# Patient Record
Sex: Female | Born: 1935 | Race: White | Hispanic: No | Marital: Married | Smoking: Never smoker
Health system: Southern US, Community
[De-identification: ages and names within clinical notes are randomized; demographics above are authoritative.]

## PROBLEM LIST (undated history)

## (undated) DIAGNOSIS — I10 Essential (primary) hypertension: Secondary | ICD-10-CM

## (undated) DIAGNOSIS — C50919 Malignant neoplasm of unspecified site of unspecified female breast: Secondary | ICD-10-CM

## (undated) DIAGNOSIS — E785 Hyperlipidemia, unspecified: Secondary | ICD-10-CM

## (undated) DIAGNOSIS — Z923 Personal history of irradiation: Secondary | ICD-10-CM

## (undated) HISTORY — DX: Hyperlipidemia, unspecified: E78.5

## (undated) HISTORY — DX: Essential (primary) hypertension: I10

---

## 1998-08-22 DIAGNOSIS — C50919 Malignant neoplasm of unspecified site of unspecified female breast: Secondary | ICD-10-CM

## 1998-08-22 HISTORY — PX: BREAST LUMPECTOMY: SHX2

## 1998-08-22 HISTORY — DX: Malignant neoplasm of unspecified site of unspecified female breast: C50.919

## 1999-04-16 ENCOUNTER — Encounter (INDEPENDENT_AMBULATORY_CARE_PROVIDER_SITE_OTHER): Payer: Self-pay | Admitting: Specialist

## 1999-04-16 ENCOUNTER — Ambulatory Visit (HOSPITAL_COMMUNITY): Admission: RE | Admit: 1999-04-16 | Discharge: 1999-04-16 | Payer: Self-pay | Admitting: Surgery

## 1999-04-16 ENCOUNTER — Encounter: Payer: Self-pay | Admitting: Surgery

## 1999-04-30 ENCOUNTER — Ambulatory Visit (HOSPITAL_BASED_OUTPATIENT_CLINIC_OR_DEPARTMENT_OTHER): Admission: RE | Admit: 1999-04-30 | Discharge: 1999-04-30 | Payer: Self-pay | Admitting: Surgery

## 1999-04-30 ENCOUNTER — Encounter: Payer: Self-pay | Admitting: Surgery

## 1999-05-26 ENCOUNTER — Encounter: Admission: RE | Admit: 1999-05-26 | Discharge: 1999-08-24 | Payer: Self-pay | Admitting: Radiation Oncology

## 1999-06-02 ENCOUNTER — Encounter: Admission: RE | Admit: 1999-06-02 | Discharge: 1999-06-02 | Payer: Self-pay | Admitting: Geriatric Medicine

## 1999-06-04 ENCOUNTER — Encounter: Admission: RE | Admit: 1999-06-04 | Discharge: 1999-06-04 | Payer: Self-pay | Admitting: Oncology

## 1999-12-01 ENCOUNTER — Encounter: Admission: RE | Admit: 1999-12-01 | Discharge: 1999-12-01 | Payer: Self-pay | Admitting: Oncology

## 1999-12-01 ENCOUNTER — Encounter: Payer: Self-pay | Admitting: Oncology

## 2000-05-01 ENCOUNTER — Other Ambulatory Visit: Admission: RE | Admit: 2000-05-01 | Discharge: 2000-05-01 | Payer: Self-pay | Admitting: *Deleted

## 2000-05-11 ENCOUNTER — Encounter: Admission: RE | Admit: 2000-05-11 | Discharge: 2000-05-11 | Payer: Self-pay | Admitting: *Deleted

## 2000-05-11 ENCOUNTER — Encounter: Payer: Self-pay | Admitting: *Deleted

## 2000-07-11 ENCOUNTER — Ambulatory Visit (HOSPITAL_COMMUNITY): Admission: RE | Admit: 2000-07-11 | Discharge: 2000-07-11 | Payer: Self-pay | Admitting: *Deleted

## 2000-07-11 ENCOUNTER — Encounter (INDEPENDENT_AMBULATORY_CARE_PROVIDER_SITE_OTHER): Payer: Self-pay

## 2000-12-05 ENCOUNTER — Encounter: Admission: RE | Admit: 2000-12-05 | Discharge: 2000-12-05 | Payer: Self-pay | Admitting: Oncology

## 2000-12-05 ENCOUNTER — Encounter: Payer: Self-pay | Admitting: Oncology

## 2001-05-04 ENCOUNTER — Other Ambulatory Visit: Admission: RE | Admit: 2001-05-04 | Discharge: 2001-05-04 | Payer: Self-pay | Admitting: *Deleted

## 2001-05-31 ENCOUNTER — Encounter: Admission: RE | Admit: 2001-05-31 | Discharge: 2001-05-31 | Payer: Self-pay | Admitting: Oncology

## 2001-05-31 ENCOUNTER — Encounter: Payer: Self-pay | Admitting: Oncology

## 2002-01-01 ENCOUNTER — Encounter: Payer: Self-pay | Admitting: Oncology

## 2002-01-01 ENCOUNTER — Encounter: Admission: RE | Admit: 2002-01-01 | Discharge: 2002-01-01 | Payer: Self-pay | Admitting: Oncology

## 2002-06-04 ENCOUNTER — Encounter: Admission: RE | Admit: 2002-06-04 | Discharge: 2002-06-04 | Payer: Self-pay | Admitting: Oncology

## 2002-06-04 ENCOUNTER — Encounter: Payer: Self-pay | Admitting: Oncology

## 2002-06-04 ENCOUNTER — Encounter (INDEPENDENT_AMBULATORY_CARE_PROVIDER_SITE_OTHER): Payer: Self-pay | Admitting: *Deleted

## 2002-06-13 ENCOUNTER — Other Ambulatory Visit: Admission: RE | Admit: 2002-06-13 | Discharge: 2002-06-13 | Payer: Self-pay | Admitting: Obstetrics and Gynecology

## 2003-03-13 ENCOUNTER — Encounter: Payer: Self-pay | Admitting: Oncology

## 2003-03-13 ENCOUNTER — Encounter: Admission: RE | Admit: 2003-03-13 | Discharge: 2003-03-13 | Payer: Self-pay | Admitting: Oncology

## 2003-06-11 ENCOUNTER — Encounter: Payer: Self-pay | Admitting: Oncology

## 2003-06-11 ENCOUNTER — Encounter: Admission: RE | Admit: 2003-06-11 | Discharge: 2003-06-11 | Payer: Self-pay | Admitting: Oncology

## 2003-06-23 ENCOUNTER — Other Ambulatory Visit: Admission: RE | Admit: 2003-06-23 | Discharge: 2003-06-23 | Payer: Self-pay | Admitting: Obstetrics and Gynecology

## 2003-09-03 ENCOUNTER — Encounter (INDEPENDENT_AMBULATORY_CARE_PROVIDER_SITE_OTHER): Payer: Self-pay | Admitting: Specialist

## 2003-09-03 ENCOUNTER — Ambulatory Visit (HOSPITAL_COMMUNITY): Admission: RE | Admit: 2003-09-03 | Discharge: 2003-09-03 | Payer: Self-pay | Admitting: Obstetrics and Gynecology

## 2004-06-11 ENCOUNTER — Encounter: Admission: RE | Admit: 2004-06-11 | Discharge: 2004-06-11 | Payer: Self-pay | Admitting: Oncology

## 2004-07-06 ENCOUNTER — Other Ambulatory Visit: Admission: RE | Admit: 2004-07-06 | Discharge: 2004-07-06 | Payer: Self-pay | Admitting: Obstetrics and Gynecology

## 2005-05-24 ENCOUNTER — Ambulatory Visit (HOSPITAL_COMMUNITY): Admission: RE | Admit: 2005-05-24 | Discharge: 2005-05-24 | Payer: Self-pay | Admitting: Gastroenterology

## 2005-06-20 ENCOUNTER — Encounter: Admission: RE | Admit: 2005-06-20 | Discharge: 2005-06-20 | Payer: Self-pay | Admitting: Surgery

## 2005-07-12 ENCOUNTER — Other Ambulatory Visit: Admission: RE | Admit: 2005-07-12 | Discharge: 2005-07-12 | Payer: Self-pay | Admitting: Obstetrics and Gynecology

## 2006-06-21 ENCOUNTER — Encounter: Admission: RE | Admit: 2006-06-21 | Discharge: 2006-06-21 | Payer: Self-pay | Admitting: Surgery

## 2007-06-25 ENCOUNTER — Encounter: Admission: RE | Admit: 2007-06-25 | Discharge: 2007-06-25 | Payer: Self-pay | Admitting: Obstetrics and Gynecology

## 2007-09-28 ENCOUNTER — Other Ambulatory Visit: Admission: RE | Admit: 2007-09-28 | Discharge: 2007-09-28 | Payer: Self-pay | Admitting: Obstetrics and Gynecology

## 2008-07-10 ENCOUNTER — Encounter: Admission: RE | Admit: 2008-07-10 | Discharge: 2008-07-10 | Payer: Self-pay | Admitting: Geriatric Medicine

## 2008-10-15 ENCOUNTER — Other Ambulatory Visit: Admission: RE | Admit: 2008-10-15 | Discharge: 2008-10-15 | Payer: Self-pay | Admitting: Obstetrics and Gynecology

## 2009-07-13 ENCOUNTER — Encounter: Admission: RE | Admit: 2009-07-13 | Discharge: 2009-07-13 | Payer: Self-pay | Admitting: Obstetrics and Gynecology

## 2009-11-11 ENCOUNTER — Other Ambulatory Visit: Admission: RE | Admit: 2009-11-11 | Discharge: 2009-11-11 | Payer: Self-pay | Admitting: Obstetrics and Gynecology

## 2010-07-20 ENCOUNTER — Encounter: Admission: RE | Admit: 2010-07-20 | Discharge: 2010-07-20 | Payer: Self-pay | Admitting: Geriatric Medicine

## 2011-01-07 NOTE — H&P (Signed)
NAME:  Samantha Bean, Samantha Bean NO.:  [id number]   MEDICAL RECORD NO.:  [id number]                   PATIENT TYPE:  AMB   LOCATION:  SDC                                  FACILITY:  [facility]   PHYSICIAN:  Artist Pais, M.D.                 DATE OF BIRTH:  07/01/1936   DATE OF ADMISSION:  09/03/2003  DATE OF DISCHARGE:                                HISTORY & PHYSICAL   PREOPERATIVE HISTORY AND PHYSICAL:   HISTORY OF PRESENT ILLNESS:  The patient is a 75 year old Caucasian female  gravida 4, para 4 who was diagnosed with left breast cancer September 2000;  she has been on tamoxifen for 4 years.  At the time of her annual  examination June 23, 2003 I offered her an ultrasound of her ovaries and  endometrium because patients with breast cancer have an increased risk of  another glandular cancer, at that time she had been on tamoxifen for 4  years, she desired to pursue the pelvic ultrasound, pelvic ultrasound was  performed and revealed the uterus to measure 7.6 x 3.7 x 4.8 cm, however,  her endometrium was quite thick measuring 14.6 mm with numerous cystic fluid  spaces and a heterogeneous echo pattern, her right and left ovaries were  noted to be normal.  She subsequently underwent a sonohistogram at which  point she was found to have fluid-filled spaces in the endometrial cavity  consistent with probable polyps and a mixed echo focus noted in the lower  endometrial cavity on the posterior wall measuring 9 x 4 mm.  The decision  was made to perform a dilation and curettage hysteroscopy because of the  mixed echogenic focus in the endometrial cavity.  Of note the fluid-filled  spaces within the myometrium was thought to be adenomyosis.  She was found  to have a mixed echogenic focus in her lower endometrial cavity on the  posterior wall of the endometrial cavity and this is the reason for her to  undergo a D&C hysteroscopy to rule out endometrial cancer.  Risks of  surgery  including anesthetic complication, hemorrhage, infection, damage to adjacent  structures including bladder, bowel, blood vessels, the ureter are discussed  with the patient.  She is made aware of the risk of uterine perforation  which could result in overwhelming life-threatening hemorrhage requiring an  emergent hysterectomy and uterine perforation which could result in bowel  damage requiring emergent colostomy or which could result in overwhelming  life-threatening peritonitis were discussed the patient.  She expressed  understanding of and acceptance of these risks and desires to proceed with  surgery.   PAST MEDICAL HISTORY:  History of lichen sclerosis but the patient has not  been following up with her treatment, vertigo, history of left breast cancer  as above, and osteopenia.   ALLERGIES:  No known drug allergies.   CURRENT MEDICATIONS:  Tamoxifen for 4 years with  Flonase and fluconazole.  She does not require SBE prophylaxis.  She occasionally takes Aleve but was  urged to discontinue this prior to her surgery.   SURGERIES:  Lumpectomy September 2000, shoulder surgery October 2002.   FAMILY HISTORY:  There is no family history of colon, breast, ovarian  cancer.  The patient's mother died at 24 of heart disease; her father died  at 73 of prostate cancer.  One sister is 58 with hypercholesterolemia,  another sister has hypertension, heart disease, and hypothyroidism.  She has  four children - the eldest of which has thyroid cancer; children range in  age from 22 to 45 years old - all of whom are alive and well.   OCCUPATION:  The patient is retired from __________ [employer].  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   TOBACCO:  None.   ALCOHOL:  None.   REVIEW OF SYSTEMS:  Noncontributory except as noted above.  Denies headache,  visual changes, chest pain, shortness of breath, abdominal pain,  change in  bowel habits, unintentional weight loss, dysuria, urgency, frequency,  vaginal pruritus or discharge, pain or bleeding with intercourse.   PHYSICAL EXAMINATION:  GENERAL APPEARANCE:  Well-developed Caucasian female.  VITAL SIGNS:  Blood pressure 136/76, height 5 feet 3-1/2 inches, weight 127.  HEENT:  Normal.  NECK:  Supple without thyromegaly, adenopathy, or nodules.  CHEST:  Clear to auscultation.  BREASTS:  The right breast is without masses, no nipple retraction or nipple  discharge.  Left breast there is a well-healed breast biopsy scar and breast  examination on the left is absolutely normal without masses, no nipple  retraction or nipple discharge.  CARDIAC:  Regular rate and rhythm without extra sounds or murmurs.  ABDOMEN:  Soft, nontender, no hepatosplenomegaly or masses.  EXTREMITIES:  No cyanosis, clubbing, or edema.  NEUROLOGIC:  Oriented x3, grossly normal.  PELVIC:  Normal external female genitalia, no vulvar, vaginal, or cervical  lesions; however, there is an area of whiteness at the left labia minora and  majora which extends over the perineum in the circumanal area consistent  with a double keyhole pattern of lichen sclerosis.  Pap smear with cytobrush  was performed on June 23, 2003 and was noted to be within normal limits.  Bimanual examination reveals the uterus to small, anteverted, mobile,  nontender, without any adnexal mass palpated.  RECTAL:  Excellent sphincter tone, confirms pelvic exam, no masses palpated.   ASSESSMENT AND PLAN:  Patient is a 75 year old Caucasian female para 4  admitted for dilation and curettage hysteroscopy due to an echogenic focus  consistent with a polyp on the posterior wall of the uterus.  Patient is  currently on tamoxifen.  Risks of surgery have been explained and she  expresses understanding of and acceptance of these risks and desires to  proceed with dilation and curettage hysteroscopy and polypectomy.  She  does not require subacute bacterial endocarditis prophylaxis as indicated above.  We did call Dr. Laverle Hobby office who has given her clearance for surgery.                                               Artist Pais, M.D.    DC/MEDQ  D:  09/02/2003  T:  09/02/2003  Job:  [id number]   cc:  [facility]   Preop [facility]

## 2011-01-07 NOTE — Op Note (Signed)
Samantha Bean, Samantha Bean NO.:  [id number]   MEDICAL RECORD NO.:  [id number]          PATIENT TYPE:  AMB   LOCATION:  ENDO                         FACILITY:  [facility]   PHYSICIAN:  Danise Edge, M.D.   DATE OF BIRTH:  10/16/1935   DATE OF PROCEDURE:  05/24/2005  DATE OF DISCHARGE:                                 OPERATIVE REPORT   PROCEDURE INDICATIONS:  Samantha Bean is a 75 year old female born  1936-03-25. Samantha Bean is scheduled to undergo her first screening  colonoscopy with polypectomy to prevent colon cancer. I discussed with her  the complications associated with colonoscopy and polypectomy including a 15  per 1000 risk of bleeding and one per 1000 risk of colon perforation. Ms  Bean signed the operative permit.   ENDOSCOPIST:  Danise Edge, M.D.   PREMEDICATION:  Versed 5 mg, Demerol 50 mg.   PROCEDURE:  After obtaining informed consent, Samantha Bean was placed in the  left lateral decubitus position. I administered intravenous Demerol and  intravenous Versed to achieve conscious sedation for the procedure. The  patient's blood pressure, oxygen saturation, and cardiac rhythm were  monitored throughout the procedure and documented in the medical record.   Anal inspection and digital rectal exam were normal. The Olympus adjustable  pediatric colonoscope was introduced into the rectum and easily advanced to  the cecum. A normal-appearing ileocecal valve and appendiceal orifice were  identified. Colonic preparation for the exam today was excellent.   Samantha Bean has universal colonic diverticulosis without diverticulitis or  diverticular stricture formation.  Rectum normal. Retroflex view of the  distal rectum normal.  Sigmoid colon and descending colon normal.  Splenic  flexure normal.  Transverse colon normal.  Hepatic flexure normal.  Ascending colon normal.  Cecum and ileocecal valve normal.   ASSESSMENT:  Normal proctocolonoscopy to  the cecum. Universal colonic  diverticulosis present.           ______________________________  Danise Edge, M.D.     MJ/MEDQ  D:  05/24/2005  T:  05/24/2005  Job:  [id number]   cc:   Dr. Silvestre Moment

## 2011-01-07 NOTE — Op Note (Signed)
NAME:  Samantha Bean, SITTS NO.:  [id number]   MEDICAL RECORD NO.:  [id number]                   PATIENT TYPE:  AMB   LOCATION:  SDC                                  FACILITY:  [facility]   PHYSICIAN:  Artist Pais, M.D.                 DATE OF BIRTH:  02-Feb-1936   DATE OF PROCEDURE:  09/03/2003  DATE OF DISCHARGE:                                 OPERATIVE REPORT   PREOPERATIVE DIAGNOSIS:  Patient with a history of tamoxifen use and  echogenic focus on sonohysterogram.   POSTOPERATIVE DIAGNOSIS:  Patient with a history of tamoxifen use and  echogenic focus on sonohysterogram.   PROCEDURE:  Dilatation and curettage, hysteroscopy for endometrial mass.   SURGEON:  Artist Pais, M.D.   ANESTHESIA:  General by LMA plus 20 mL 1% lidocaine paracervical block.   DRAINS:  None.   FLUIDS:  500 mL of crystalloid.   ESTIMATED BLOOD LOSS:  Minimal.   COMPLICATIONS:  None.   DESCRIPTION OF OPERATION:  The patient was brought to the operating room,  identified on the operating table.  After induction of adequate general  anesthesia by LMA, the patient was placed in a dorsal lithotomy position and  prepped and draped in the usual sterile fashion.  Her bladder was straight-  catheterized for approximately 100 mL of clear yellow urine.  An examination  under anesthesia revealed the uterus to be slightly retroverted, about six  weeks, mobile.  The speculum was placed and the posterior lip of the cervix  was infiltrated with 1 mL of 1% lidocaine and grasped with a single-tooth  tenaculum.  The remaining 19 mL of lidocaine were placed for a paracervical  block.  The uterus was noted to be very gently retroverted, and the cervix  was very gently dilated up to a #25 Pratt dilator.  Dilatation proceeded  very gently and carefully to decrease the risk of uterine perforation.  The  uterus sounded to approximately 7 cm.  using sorbitol as a distending medium  and using the [facility]  hysteroscope, the hysteroscope was placed.  We did have  to increase the pressure from 70 to 85, and still the fluid was unable to  distend the cavity.  I did attempt a thorough hysteroscopic examination, but  the image was blurred.  Subsequently the scope was withdrawn and a careful  curettage was performed in a systematic clockwise fashion until a good cry  was heard all around.  I also placed the Randall stone forceps and was able  to remove the endometrial mass in its entirety, and it was noted to be as on  sonohysterogram at the lower uterine segment.  After a good cry was heard  all around I replaced the hysteroscope, was able to see in the lower uterine  segment, but still was unable to have the uterus distend and I did not think  it was prudent  to increase the pressure as I was able to obtain very  representative curettings.  At that point the procedure was then terminated.  The tenaculum was removed and there was noted to be no bleeding from either  the tenaculum site or from the paracervical block.  At that point the  procedure was terminated.  The patient was transferred to the recovery room  in stable condition after all instrument, sponge, and needle counts were  correct.  She did receive the postop D&C instruction sheet.  I have asked  her please not to go to church tonight, as her husband is a Education officer, environmental and she  thinks she needs to go to church.  I have asked her to rest to  decrease the risk of bleeding.  She is to take ibuprofen 400 to 600 mg every  six hours as needed for pain.  We will see her back in two weeks, sooner  p.r.n.  She is urged to call with bleeding so heavy that she is soaking a  pad an hour for three straight hours or if she has any other problems.                                               Artist Pais, M.D.    DC/MEDQ  D:  09/03/2003  T:  09/03/2003  Job:  [id number]   cc:   Hal T. Stoneking, M.D.  [address] [place], [place] [address]  Fax:  [phone]   Lennis P. Darrold Span, M.D.  [address] [facility]  [place]  [place] [address]  Fax: [phone]

## 2011-01-07 NOTE — Op Note (Signed)
[facility]  Patient:    Samantha Bean, Samantha Bean                     MRN: [id number] Proc. Date: 07/11/00 Adm. Date:  [id number] Attending:  Ardeen Fillers CC:         Sung Amabile. Roslyn Smiling, M.D.   Operative Report  PREOPERATIVE DIAGNOSES:       1. Thickened endometrium on ultrasound.                               2. Tamoxifen use.                               3. Severe cervical stenosis.  POSTOPERATIVE DIAGNOSES:      1. Thickened endometrium on ultrasound.                               2. Tamoxifen use.                               3. Severe cervical stenosis.  OPERATION:                    Operative hysteroscopy and D&C.  SURGEON:                      Sung Amabile. Roslyn Smiling, M.D.  ANESTHESIA:                   General.  Anesthesia via LMA paracervical block.  ESTIMATED BLOOD LOSS:         Less than 50 cc.  TUBES/DRAINS:                 None.  COMPLICATIONS:                None.  FINDINGS:                     Uterus sounded to 7 cm.  Cervical os pinpoint before dilation.  Multiple polypoid endometrial masses seen and removed.  SPECIMENS:                    Endometrial resectoscopic biopsies and endometrial curetting to pathology.  INDICATIONS:                  A 75 year old woman receiving Tamoxifen for more than a year, with severe cervical stenosis and thickened endometrium on ultrasound.  Admitted for operative hysteroscopy to rule out endometrial pathology.  DESCRIPTION OF PROCEDURE:     After the establishment of general anesthesia, the patient was placed in the dorsal lithotomy position.  The perineum and vagina were prepped with Betadine solution and the body was evacuated with straight catheterization.  The patient was draped.  Examination was performed.  The uterus appeared to be anteverted, normal size, and no adnexal masses were palpated.  Weighted speculum was inserted in the vagina.  The anterior cervical lip was infiltrated with 1%  Xylocaine and then grasped with a single-tooth tenaculum.  Sims retractor was used to gently retract the bladder.  Paracervical block was placed in the usual fashion using 15 cc of 1% Xylocaine.  The posterior lip of the cervix was then grasped  with a single-tooth tenaculum and the anterior lip was removed.  Better traction was achieved with the posterior single-tooth tenaculum.  Fine Hanks dilators were used to negotiate the direction of the endocervical canal.  Serial dilations was accomplished.  Then with [facility] dilators, the uterus was sounded to 7 cm.  Cervix was dilated to #33 [place].  Operative hysteroscope was introduced into the intrauterine cavity without problem. Sorbitol was then distending medium.  The introduction pressure of the fluid ranged between 40 and 70 mmHg during this procedure.  A 90 degree double resectoscopic loupe was used with setting of 190 and 110, cutting and coagulation respectively.  Photographs were taken.  Polypoid tissue was resected.  Several polyps were in the cornual area and unable to be reached with the resectoscopic loupe.  The loupe was removed and resectoscopic biopsies were taken with the resectoscopic forceps.  The loupe was reintroduced.  Several more biopsies were taken.  One last polypoid mass in the right cornual was noted.  The scope was again switched to biopsy forceps and this was biopsied.  Instruments were removed.  Sharp curettage was performed.  Hemostasis was noted and instruments were removed.  The patient was returned to the supine position, extubated without difficulty, and transported to the recovery room in satisfactory condition.  Sorbitol deficit at the end of the case was 110 mL. DD:  07/11/00 TD:  07/12/00 Job: [id number]

## 2011-01-07 NOTE — H&P (Signed)
[facility]  Patient:    Samantha Bean, Samantha Bean                       MRN: [id number] Adm. Date:  07/11/00 Attending:  Sung Amabile. Roslyn Smiling, M.D.                         History and Physical  DATE OF BIRTH:                February 27, 1936  CHIEF COMPLAINT:              Thickened endometrium in Tamoxifen user and cervical stenosis.  HISTORY OF PRESENT ILLNESS:   A 75 year old woman G4, P4 receiving Tamoxifen for breast carcinoma for more than a year admitted for operative hysteroscopy. Patient has had no postmenopausal bleeding, but endometrial thickness in the office on ultrasound was 1.24-1.37 cm in October.  She has severe cervical stenosis and sampling can not be done in the office.  She is admitted now for this procedure.  PAST MEDICAL HISTORY:         Breast carcinoma.  PAST SURGICAL HISTORY:        Left breast lumpectomy.  MEDICATIONS:                  Tamoxifen and calcium.  ALLERGIES:                    None.  OBSTETRICAL HISTORY:          Vaginal deliveries x 4.  FAMILY HISTORY:               Father with prostatic carcinoma.  Mother with heart problems.  Son with ?thyroid carcinoma.  SOCIAL HISTORY:               Married.  Retired.  Denies tobacco or ethanol use.  PHYSICAL EXAMINATION:  GENERAL:                      Healthy appearing woman.  VITAL SIGNS:                  Afebrile.  Vital signs stable.  HEENT:                        Within normal limits.  NECK:                         Without thyromegaly.  CHEST:                        Clear.  COR:                          Regular rate and rhythm.  S1, S2 normal.  BREASTS:                      Well healed left surgical scar.  Breasts otherwise without mass, tenderness, discharge, supraclavicular or axillary nodes.  ABDOMEN:                      Soft, nontender without organomegaly, mass, or hernia.  GENITOURINARY:                External genitalia, BUS, vagina:  Slightly atrophic  without lesions.  Cervix  with severe cervical stenosis.  Uterus: Small, anteverted, nontender, mobile.  Adnexa:  Nonpalpable.  Rectovaginal examination:  Confirmatory.  EXTREMITIES:                  Without CCE.  SKIN:                         Without lesions.  NEUROLOGIC:                   Grossly intact.  LABORATORIES:                 Pap smear normal September 2001.  ASSESSMENT AND PLAN:          1. Thickened endometrium in Tamoxifen user.                                  Rule out endometrial pathology.                               2. Severe cervical os stenosis-procluding                                  sampling in the office.                               3. Tamoxifen use.                               4. Breast carcinoma.  PLAN:                         Operative hysteroscopy and dilation and curettage on July 11, 2000. DD:  07/10/00 TD:  07/10/00 Job: [id number]

## 2011-05-31 ENCOUNTER — Emergency Department (HOSPITAL_COMMUNITY)
Admission: EM | Admit: 2011-05-31 | Discharge: 2011-05-31 | Disposition: A | Discharge status: Home / Self Care | Payer: Medicare (Managed Care) | Attending: Emergency Medicine | Admitting: Emergency Medicine

## 2011-05-31 DIAGNOSIS — R51 Headache: Secondary | ICD-10-CM | POA: Insufficient documentation

## 2011-05-31 DIAGNOSIS — Z79899 Other long term (current) drug therapy: Secondary | ICD-10-CM | POA: Insufficient documentation

## 2011-05-31 DIAGNOSIS — I10 Essential (primary) hypertension: Secondary | ICD-10-CM | POA: Insufficient documentation

## 2011-05-31 LAB — URINE MICROSCOPIC-ADD ON

## 2011-05-31 LAB — URINALYSIS, ROUTINE W REFLEX MICROSCOPIC
Bilirubin Urine: NEGATIVE
Glucose, UA: NEGATIVE mg/dL
Ketones, ur: 15 mg/dL — AB
Leukocytes, UA: NEGATIVE
Nitrite: NEGATIVE
Protein, ur: NEGATIVE mg/dL
Urobilinogen, UA: 0.2 mg/dL (ref 0.0–1.0)
pH: 5 (ref 5.0–8.0)

## 2011-05-31 LAB — DIFFERENTIAL
Basophils Absolute: 0.1 10*3/uL (ref 0.0–0.1)
Basophils Relative: 1 % (ref 0–1)
Eosinophils Absolute: 0.4 10*3/uL (ref 0.0–0.7)
Eosinophils Relative: 7 % — ABNORMAL HIGH (ref 0–5)
Lymphocytes Relative: 32 % (ref 12–46)
Lymphs Abs: 1.7 10*3/uL (ref 0.7–4.0)
Monocytes Relative: 6 % (ref 3–12)
Neutrophils Relative %: 55 % (ref 43–77)

## 2011-05-31 LAB — CBC
HCT: 42.5 % (ref 36.0–46.0)
MCH: 28.6 pg (ref 26.0–34.0)
MCHC: 33.2 g/dL (ref 30.0–36.0)
Platelets: 223 10*3/uL (ref 150–400)
RBC: 4.93 MIL/uL (ref 3.87–5.11)
RDW: 13.7 % (ref 11.5–15.5)
WBC: 5.3 10*3/uL (ref 4.0–10.5)

## 2011-05-31 LAB — BASIC METABOLIC PANEL
CO2: 30 mEq/L (ref 19–32)
Calcium: 10.8 mg/dL — ABNORMAL HIGH (ref 8.4–10.5)
Creatinine, Ser: 1 mg/dL (ref 0.50–1.10)
GFR calc Af Amer: 63 mL/min — ABNORMAL LOW (ref >90)
GFR calc non Af Amer: 55 mL/min — ABNORMAL LOW (ref >90)
Glucose, Bld: 95 mg/dL (ref 70–99)
Sodium: 144 mEq/L (ref 135–145)

## 2011-06-15 ENCOUNTER — Other Ambulatory Visit: Payer: Self-pay | Admitting: Geriatric Medicine

## 2011-06-15 DIAGNOSIS — Z1231 Encounter for screening mammogram for malignant neoplasm of breast: Secondary | ICD-10-CM

## 2011-07-22 ENCOUNTER — Ambulatory Visit
Admission: RE | Admit: 2011-07-22 | Discharge: 2011-07-22 | Disposition: A | Discharge status: Home / Self Care | Payer: No Typology Code available for payment source | Source: Ambulatory Visit | Attending: Geriatric Medicine | Admitting: Geriatric Medicine

## 2011-07-22 DIAGNOSIS — Z1231 Encounter for screening mammogram for malignant neoplasm of breast: Secondary | ICD-10-CM

## 2012-06-08 ENCOUNTER — Other Ambulatory Visit: Payer: Self-pay | Admitting: Geriatric Medicine

## 2012-06-08 DIAGNOSIS — Z1231 Encounter for screening mammogram for malignant neoplasm of breast: Secondary | ICD-10-CM

## 2012-07-23 ENCOUNTER — Ambulatory Visit
Admission: RE | Admit: 2012-07-23 | Discharge: 2012-07-23 | Disposition: A | Discharge status: Home / Self Care | Payer: Medicare Other | Source: Ambulatory Visit | Attending: Geriatric Medicine | Admitting: Geriatric Medicine

## 2012-07-23 DIAGNOSIS — Z1231 Encounter for screening mammogram for malignant neoplasm of breast: Secondary | ICD-10-CM

## 2013-03-04 ENCOUNTER — Other Ambulatory Visit: Payer: Self-pay | Admitting: Gastroenterology

## 2013-03-04 DIAGNOSIS — R1013 Epigastric pain: Secondary | ICD-10-CM

## 2013-03-08 ENCOUNTER — Other Ambulatory Visit: Payer: Medicare Other

## 2013-03-08 ENCOUNTER — Ambulatory Visit
Admission: RE | Admit: 2013-03-08 | Discharge: 2013-03-08 | Disposition: A | Discharge status: Home / Self Care | Payer: Medicare Other | Source: Ambulatory Visit | Attending: Gastroenterology | Admitting: Gastroenterology

## 2013-03-08 DIAGNOSIS — R1013 Epigastric pain: Secondary | ICD-10-CM

## 2013-06-20 ENCOUNTER — Other Ambulatory Visit: Payer: Self-pay

## 2013-06-20 DIAGNOSIS — Z1231 Encounter for screening mammogram for malignant neoplasm of breast: Secondary | ICD-10-CM

## 2013-07-26 ENCOUNTER — Ambulatory Visit
Admission: RE | Admit: 2013-07-26 | Discharge: 2013-07-26 | Disposition: A | Discharge status: Home / Self Care | Payer: Medicare Other | Source: Ambulatory Visit

## 2013-07-26 DIAGNOSIS — Z1231 Encounter for screening mammogram for malignant neoplasm of breast: Secondary | ICD-10-CM

## 2014-06-30 ENCOUNTER — Other Ambulatory Visit: Payer: Self-pay

## 2014-06-30 DIAGNOSIS — Z9889 Other specified postprocedural states: Secondary | ICD-10-CM

## 2014-06-30 DIAGNOSIS — Z1231 Encounter for screening mammogram for malignant neoplasm of breast: Secondary | ICD-10-CM

## 2014-06-30 DIAGNOSIS — Z853 Personal history of malignant neoplasm of breast: Secondary | ICD-10-CM

## 2014-07-29 ENCOUNTER — Ambulatory Visit: Payer: Medicare Other

## 2014-07-31 ENCOUNTER — Ambulatory Visit
Admission: RE | Admit: 2014-07-31 | Discharge: 2014-07-31 | Disposition: A | Discharge status: Home / Self Care | Payer: Medicare Other | Source: Ambulatory Visit

## 2014-07-31 ENCOUNTER — Ambulatory Visit: Payer: Medicare Other

## 2014-07-31 DIAGNOSIS — Z853 Personal history of malignant neoplasm of breast: Secondary | ICD-10-CM

## 2014-07-31 DIAGNOSIS — Z1231 Encounter for screening mammogram for malignant neoplasm of breast: Secondary | ICD-10-CM

## 2014-07-31 DIAGNOSIS — Z9889 Other specified postprocedural states: Secondary | ICD-10-CM

## 2015-07-07 ENCOUNTER — Other Ambulatory Visit: Payer: Self-pay

## 2015-07-07 DIAGNOSIS — Z1231 Encounter for screening mammogram for malignant neoplasm of breast: Secondary | ICD-10-CM

## 2015-07-27 ENCOUNTER — Ambulatory Visit
Admission: RE | Admit: 2015-07-27 | Discharge: 2015-07-27 | Disposition: A | Discharge status: Home / Self Care | Payer: PPO | Source: Ambulatory Visit

## 2015-07-27 DIAGNOSIS — Z1231 Encounter for screening mammogram for malignant neoplasm of breast: Secondary | ICD-10-CM

## 2015-08-04 ENCOUNTER — Other Ambulatory Visit (HOSPITAL_COMMUNITY): Payer: Self-pay | Admitting: Geriatric Medicine

## 2015-08-04 DIAGNOSIS — R011 Cardiac murmur, unspecified: Secondary | ICD-10-CM

## 2015-08-14 ENCOUNTER — Other Ambulatory Visit (HOSPITAL_COMMUNITY): Payer: PPO

## 2015-08-19 ENCOUNTER — Ambulatory Visit (HOSPITAL_COMMUNITY): Payer: PPO | Attending: Cardiovascular Disease

## 2015-08-19 ENCOUNTER — Other Ambulatory Visit: Payer: Self-pay

## 2015-08-19 DIAGNOSIS — I071 Rheumatic tricuspid insufficiency: Secondary | ICD-10-CM | POA: Insufficient documentation

## 2015-08-19 DIAGNOSIS — I351 Nonrheumatic aortic (valve) insufficiency: Secondary | ICD-10-CM | POA: Insufficient documentation

## 2015-08-19 DIAGNOSIS — R011 Cardiac murmur, unspecified: Secondary | ICD-10-CM

## 2015-08-19 DIAGNOSIS — I371 Nonrheumatic pulmonary valve insufficiency: Secondary | ICD-10-CM | POA: Insufficient documentation

## 2015-10-13 DIAGNOSIS — J329 Chronic sinusitis, unspecified: Secondary | ICD-10-CM | POA: Diagnosis not present

## 2016-01-29 DIAGNOSIS — I10 Essential (primary) hypertension: Secondary | ICD-10-CM | POA: Diagnosis not present

## 2016-01-29 DIAGNOSIS — J329 Chronic sinusitis, unspecified: Secondary | ICD-10-CM | POA: Diagnosis not present

## 2016-06-21 ENCOUNTER — Other Ambulatory Visit: Payer: Self-pay | Admitting: Geriatric Medicine

## 2016-06-21 DIAGNOSIS — Z1231 Encounter for screening mammogram for malignant neoplasm of breast: Secondary | ICD-10-CM

## 2016-07-27 ENCOUNTER — Ambulatory Visit
Admission: RE | Admit: 2016-07-27 | Discharge: 2016-07-27 | Disposition: A | Discharge status: Home / Self Care | Payer: PPO | Source: Ambulatory Visit | Attending: Geriatric Medicine | Admitting: Geriatric Medicine

## 2016-07-27 DIAGNOSIS — Z1231 Encounter for screening mammogram for malignant neoplasm of breast: Secondary | ICD-10-CM | POA: Diagnosis not present

## 2016-09-15 DIAGNOSIS — Z0000 Encounter for general adult medical examination without abnormal findings: Secondary | ICD-10-CM | POA: Diagnosis not present

## 2016-09-15 DIAGNOSIS — I10 Essential (primary) hypertension: Secondary | ICD-10-CM | POA: Diagnosis not present

## 2016-09-15 DIAGNOSIS — E7800 Pure hypercholesterolemia, unspecified: Secondary | ICD-10-CM | POA: Diagnosis not present

## 2016-09-15 DIAGNOSIS — Z23 Encounter for immunization: Secondary | ICD-10-CM | POA: Diagnosis not present

## 2016-09-15 DIAGNOSIS — Z79899 Other long term (current) drug therapy: Secondary | ICD-10-CM | POA: Diagnosis not present

## 2016-09-15 DIAGNOSIS — Z1389 Encounter for screening for other disorder: Secondary | ICD-10-CM | POA: Diagnosis not present

## 2016-09-15 DIAGNOSIS — H539 Unspecified visual disturbance: Secondary | ICD-10-CM | POA: Diagnosis not present

## 2016-09-29 DIAGNOSIS — H04129 Dry eye syndrome of unspecified lacrimal gland: Secondary | ICD-10-CM | POA: Diagnosis not present

## 2016-11-14 DIAGNOSIS — E7800 Pure hypercholesterolemia, unspecified: Secondary | ICD-10-CM | POA: Diagnosis not present

## 2016-11-14 DIAGNOSIS — I10 Essential (primary) hypertension: Secondary | ICD-10-CM | POA: Diagnosis not present

## 2016-12-13 DIAGNOSIS — R3121 Asymptomatic microscopic hematuria: Secondary | ICD-10-CM | POA: Diagnosis not present

## 2016-12-29 DIAGNOSIS — R3121 Asymptomatic microscopic hematuria: Secondary | ICD-10-CM | POA: Diagnosis not present

## 2016-12-29 DIAGNOSIS — R319 Hematuria, unspecified: Secondary | ICD-10-CM | POA: Diagnosis not present

## 2017-04-14 DIAGNOSIS — R3121 Asymptomatic microscopic hematuria: Secondary | ICD-10-CM | POA: Diagnosis not present

## 2017-05-04 DIAGNOSIS — R102 Pelvic and perineal pain: Secondary | ICD-10-CM | POA: Diagnosis not present

## 2017-05-25 DIAGNOSIS — Z23 Encounter for immunization: Secondary | ICD-10-CM | POA: Diagnosis not present

## 2017-05-30 DIAGNOSIS — R102 Pelvic and perineal pain: Secondary | ICD-10-CM | POA: Diagnosis not present

## 2017-06-13 ENCOUNTER — Other Ambulatory Visit: Payer: Self-pay | Admitting: Nurse Practitioner

## 2017-06-13 DIAGNOSIS — R9389 Abnormal findings on diagnostic imaging of other specified body structures: Secondary | ICD-10-CM | POA: Diagnosis not present

## 2017-06-13 DIAGNOSIS — R102 Pelvic and perineal pain: Secondary | ICD-10-CM | POA: Diagnosis not present

## 2017-06-16 ENCOUNTER — Other Ambulatory Visit: Payer: Self-pay | Admitting: Geriatric Medicine

## 2017-06-16 DIAGNOSIS — Z1231 Encounter for screening mammogram for malignant neoplasm of breast: Secondary | ICD-10-CM

## 2017-06-22 DIAGNOSIS — R300 Dysuria: Secondary | ICD-10-CM | POA: Diagnosis not present

## 2017-07-10 DIAGNOSIS — N390 Urinary tract infection, site not specified: Secondary | ICD-10-CM | POA: Diagnosis not present

## 2017-07-28 ENCOUNTER — Ambulatory Visit: Payer: PPO

## 2017-08-28 ENCOUNTER — Ambulatory Visit
Admission: RE | Admit: 2017-08-28 | Discharge: 2017-08-28 | Disposition: A | Discharge status: Home / Self Care | Payer: PPO | Source: Ambulatory Visit | Attending: Geriatric Medicine | Admitting: Geriatric Medicine

## 2017-08-28 DIAGNOSIS — Z1231 Encounter for screening mammogram for malignant neoplasm of breast: Secondary | ICD-10-CM

## 2017-08-28 HISTORY — DX: Personal history of irradiation: Z92.3

## 2017-08-28 HISTORY — DX: Malignant neoplasm of unspecified site of unspecified female breast: C50.919

## 2017-09-18 DIAGNOSIS — Z1389 Encounter for screening for other disorder: Secondary | ICD-10-CM | POA: Diagnosis not present

## 2017-09-18 DIAGNOSIS — Z79899 Other long term (current) drug therapy: Secondary | ICD-10-CM | POA: Diagnosis not present

## 2017-09-18 DIAGNOSIS — Z23 Encounter for immunization: Secondary | ICD-10-CM | POA: Diagnosis not present

## 2017-09-18 DIAGNOSIS — E7800 Pure hypercholesterolemia, unspecified: Secondary | ICD-10-CM | POA: Diagnosis not present

## 2017-09-18 DIAGNOSIS — L609 Nail disorder, unspecified: Secondary | ICD-10-CM | POA: Diagnosis not present

## 2017-09-18 DIAGNOSIS — Z0000 Encounter for general adult medical examination without abnormal findings: Secondary | ICD-10-CM | POA: Diagnosis not present

## 2017-09-18 DIAGNOSIS — I10 Essential (primary) hypertension: Secondary | ICD-10-CM | POA: Diagnosis not present

## 2017-10-24 DIAGNOSIS — R9389 Abnormal findings on diagnostic imaging of other specified body structures: Secondary | ICD-10-CM | POA: Diagnosis not present

## 2017-12-18 DIAGNOSIS — M79671 Pain in right foot: Secondary | ICD-10-CM | POA: Diagnosis not present

## 2017-12-18 DIAGNOSIS — I10 Essential (primary) hypertension: Secondary | ICD-10-CM | POA: Diagnosis not present

## 2017-12-18 DIAGNOSIS — M79672 Pain in left foot: Secondary | ICD-10-CM | POA: Diagnosis not present

## 2018-01-05 DIAGNOSIS — I10 Essential (primary) hypertension: Secondary | ICD-10-CM | POA: Diagnosis not present

## 2018-01-05 DIAGNOSIS — R5383 Other fatigue: Secondary | ICD-10-CM | POA: Diagnosis not present

## 2018-01-05 DIAGNOSIS — R29898 Other symptoms and signs involving the musculoskeletal system: Secondary | ICD-10-CM | POA: Diagnosis not present

## 2018-01-08 ENCOUNTER — Other Ambulatory Visit (HOSPITAL_COMMUNITY): Payer: Self-pay | Admitting: Nurse Practitioner

## 2018-01-08 DIAGNOSIS — I10 Essential (primary) hypertension: Secondary | ICD-10-CM

## 2018-01-08 DIAGNOSIS — E7800 Pure hypercholesterolemia, unspecified: Secondary | ICD-10-CM

## 2018-01-08 DIAGNOSIS — R29898 Other symptoms and signs involving the musculoskeletal system: Secondary | ICD-10-CM

## 2018-01-09 ENCOUNTER — Telehealth (HOSPITAL_COMMUNITY): Payer: Self-pay | Admitting: *Deleted

## 2018-01-09 NOTE — Telephone Encounter (Signed)
Left message on voicemail in reference to upcoming appointment scheduled for 01/12/18. Phone number given for a call back so details instructions can be given. Adalyn Pennock, Ranae Palms

## 2018-01-12 ENCOUNTER — Encounter (HOSPITAL_COMMUNITY): Payer: PPO

## 2018-01-12 DIAGNOSIS — I10 Essential (primary) hypertension: Secondary | ICD-10-CM | POA: Diagnosis not present

## 2018-01-12 DIAGNOSIS — R300 Dysuria: Secondary | ICD-10-CM | POA: Diagnosis not present

## 2018-01-23 ENCOUNTER — Ambulatory Visit (INDEPENDENT_AMBULATORY_CARE_PROVIDER_SITE_OTHER)
Admission: RE | Admit: 2018-01-23 | Discharge: 2018-01-23 | Disposition: A | Discharge status: Home / Self Care | Payer: PPO | Source: Ambulatory Visit | Attending: Cardiology | Admitting: Cardiology

## 2018-01-23 ENCOUNTER — Encounter: Payer: Self-pay | Admitting: Cardiology

## 2018-01-23 ENCOUNTER — Ambulatory Visit: Payer: PPO | Admitting: Cardiology

## 2018-01-23 VITALS — BP 168/64 | HR 65 | Ht 63.5 in | Wt 123.4 lb

## 2018-01-23 DIAGNOSIS — K7689 Other specified diseases of liver: Secondary | ICD-10-CM | POA: Diagnosis not present

## 2018-01-23 DIAGNOSIS — I10 Essential (primary) hypertension: Secondary | ICD-10-CM | POA: Diagnosis not present

## 2018-01-23 DIAGNOSIS — E7800 Pure hypercholesterolemia, unspecified: Secondary | ICD-10-CM | POA: Diagnosis not present

## 2018-01-23 DIAGNOSIS — E785 Hyperlipidemia, unspecified: Secondary | ICD-10-CM | POA: Insufficient documentation

## 2018-01-23 DIAGNOSIS — M542 Cervicalgia: Secondary | ICD-10-CM | POA: Insufficient documentation

## 2018-01-23 DIAGNOSIS — R0989 Other specified symptoms and signs involving the circulatory and respiratory systems: Secondary | ICD-10-CM | POA: Diagnosis not present

## 2018-01-23 DIAGNOSIS — R6889 Other general symptoms and signs: Secondary | ICD-10-CM

## 2018-01-23 LAB — BASIC METABOLIC PANEL
BUN / CREAT RATIO: 22 (ref 12–28)
BUN: 19 mg/dL (ref 8–27)
CO2: 31 mmol/L — AB (ref 20–29)
Calcium: 10.2 mg/dL (ref 8.7–10.3)
Chloride: 98 mmol/L (ref 96–106)
Creatinine, Ser: 0.85 mg/dL (ref 0.57–1.00)
GFR calc Af Amer: 75 mL/min/{1.73_m2} (ref >59)
GFR calc non Af Amer: 65 mL/min/{1.73_m2} (ref >59)
GLUCOSE: 93 mg/dL (ref 65–99)
POTASSIUM: 4.1 mmol/L (ref 3.5–5.2)
SODIUM: 134 mmol/L (ref 134–144)

## 2018-01-23 MED ORDER — IOPAMIDOL (ISOVUE-370) INJECTION 76%
100.0000 mL | Freq: Once | INTRAVENOUS | Status: AC | PRN
  Administered 2018-01-23: 100 mL via INTRAVENOUS

## 2018-01-23 NOTE — Patient Instructions (Signed)
Medication Instructions:  Your physician recommends that you continue on your current medications as directed. Please refer to the Current Medication list given to you today.  If you need a refill on your cardiac medications, please contact your pharmacy first.  Labwork: Today for kidney function   Testing/Procedures: Your physician has requested that you have an echocardiogram as soon as possible. Echocardiography is a painless test that uses sound waves to create images of your heart. It provides your doctor with information about the size and shape of your heart and how well your heart's chambers and valves are working. This procedure takes approximately one hour. There are no restrictions for this procedure.  Your physician has requested that you have en exercise stress myoview as soon as possible. For further information please visit [email or web address]. Please follow instruction sheet, as given.  Non-Cardiac CT Angiography (CTA), is a special type of CT scan that uses a computer to produce multi-dimensional views of major blood vessels throughout the body. In CT angiography, a contrast material is injected through an IV to help visualize the blood vessels  Follow-Up: Your physician wants you to follow-up AS NEEDED with Dr. Radford Pax.  Any Other Special Instructions Will Be Listed Below (If Applicable).   Thank you for choosing [facility], RN  [phone]  If you need a refill on your cardiac medications before your next appointment, please call your pharmacy.

## 2018-01-23 NOTE — Progress Notes (Signed)
Cardiology Office Note    Date:  01/23/2018   ID:  Samantha Bean, DOB 1936/08/17, MRN [id number]  PCP:  Lajean Manes, MD  Cardiologist:  Fransico Him, MD   Chief Complaint  Patient presents with  . New Patient (Initial Visit)    exercise intolerance, neck pain, HTN    History of Present Illness:  Samantha Bean is a 82 y.o. female who is being seen today for the evaluation of neck tightness with a history of hyperlipidemia at the request of Dianna Rossetti, NP.    This is a very pleasant 82 year old female with a history of breast CA is post XRT, HTN and hyperlipidemia is been having some episodic neck tightness.  She saw her PCP last month stating that she was having some episodic problems with tightness in her neck.  EKG was done which showed nonspecific ST abnormality.  Because of her cardiac risk factors including advanced age, hypertension and hyperlipidemia she was referred for further cardiac evaluation.   Past Medical History:  Diagnosis Date  . Benign essential HTN   . Breast cancer ([place]) 2000   Left  . Hyperlipidemia   . Personal history of radiation therapy     Past Surgical History:  Procedure Laterality Date  . BREAST LUMPECTOMY Left 2000    Current Medications: Current Meds  Medication Sig  . amLODipine (NORVASC) 5 MG tablet Take 5 mg by mouth daily.  Marland Kitchen BIOTIN PO Take 1 tablet by mouth daily.  Marland Kitchen CALCIUM PO Take 1 tablet by mouth daily.  . Cholecalciferol (VITAMIN D3 PO) Take 250 mg by mouth daily.  . hydrochlorothiazide (HYDRODIURIL) 12.5 MG tablet Take 12.5 mg by mouth daily.   . Multiple Vitamin (MULTIVITAMIN) tablet Take 1 tablet by mouth daily.  . simvastatin (ZOCOR) 10 MG tablet Take 10 mg by mouth daily.     Allergies:   Patient has no known allergies.   Social History   Socioeconomic History  . Marital status: Married    Spouse name: Not on file  . Number of children: Not on file  . Years of education: Not on file  . Highest  education level: Not on file  Occupational History  . Not on file  Social Needs  . Financial resource strain: Not on file  . Food insecurity:    Worry: Not on file    Inability: Not on file  . Transportation needs:    Medical: Not on file    Non-medical: Not on file  Tobacco Use  . Smoking status: Never Smoker  . Smokeless tobacco: Never Used  Substance and Sexual Activity  . Alcohol use: Not on file  . Drug use: Not on file  . Sexual activity: Not on file  Lifestyle  . Physical activity:    Days per week: Not on file    Minutes per session: Not on file  . Stress: Not on file  Relationships  . Social connections:    Talks on phone: Not on file    Gets together: Not on file    Attends religious service: Not on file    Active member of club or organization: Not on file    Attends meetings of clubs or organizations: Not on file    Relationship status: Not on file  Other Topics Concern  . Not on file  Social History Narrative  . Not on file     Family History:  The patient's family history includes CAD in her sister and  sister; Coronary artery disease in her mother.   ROS:   Please see the history of present illness.    ROS All other systems reviewed and are negative.  No flowsheet data found.     PHYSICAL EXAM:   VS:  BP (!) 168/64   Pulse 65   Ht 5' 3.5" (1.613 m)   Wt 123 lb 6.4 oz (56 kg)   BMI 21.52 kg/m    GEN: Well nourished, well developed, in no acute distress  HEENT: normal  Neck: no JVD, carotid bruits, or masses Cardiac: RRR; no murmurs, rubs, or gallops,no edema.  Intact distal pulses bilaterally.  Respiratory:  clear to auscultation bilaterally, normal work of breathing GI: soft, nontender, nondistended, + BS MS: no deformity or atrophy  Skin: warm and dry, no rash Neuro:  Alert and Oriented x 3, Strength and sensation are intact Psych: euthymic mood, full affect  Wt Readings from Last 3 Encounters:  01/23/18 123 lb 6.4 oz (56 kg)       Studies/Labs Reviewed:   EKG:  EKG is ordered today.  The ekg ordered today demonstrates normal sinus rhythm at 65 bpm with PAC and no ST changes.  Recent Labs: No results found for requested labs within last 8760 hours.   Lipid Panel No results found for: CHOL, TRIG, HDL, CHOLHDL, VLDL, LDLCALC, LDLDIRECT  Additional studies/ records that were reviewed today include:  none    ASSESSMENT:    1. Exercise intolerance   2. Pure hypercholesterolemia   3. Benign essential HTN   4. Abdominal bruit   5. Renal bruit      PLAN:  In order of problems listed above:  1.  Exercise intolerance with bilateral neck pain.  Her exercise intolerance and bilateral neck pain could be an anginal equivalent.  She does have cardiac risk factors including a strong family history of coronary disease, hypertension, hyperlipidemia and evidence of bruits in the renal arteries and abdominal aorta.  I will get a stress Myoview to rule out ischemia.  I will check a 2D echocardiogram to evaluate LV function.  2.  Hyperlipidemia -her LDL was 125 on 09/18/2017.  She was placed on simvastatin 10 mg daily.  3.  Hypertension -BP is well controlled on exam today.  She will continue on HCTZ 12.5 mg daily and amlodipine 2.5 mg daily.  4.  Abdominal bruit -will be evaluated at the time of her CT angios of the abdomen which is being done to assess her renal artery bruits.  5.  Renal artery bruits bilaterally -given her newly diagnosed hypertension she may have underlying renal artery stenosis.  I will check CT angio of the chest and abdomen.    Medication Adjustments/Labs and Tests Ordered: Current medicines are reviewed at length with the patient today.  Concerns regarding medicines are outlined above.  Medication changes, Labs and Tests ordered today are listed in the Patient Instructions below.  Patient Instructions  Medication Instructions:  Your physician recommends that you continue on your current  medications as directed. Please refer to the Current Medication list given to you today.  If you need a refill on your cardiac medications, please contact your pharmacy first.  Labwork: Today for kidney function   Testing/Procedures: Your physician has requested that you have an echocardiogram as soon as possible. Echocardiography is a painless test that uses sound waves to create images of your heart. It provides your doctor with information about the size and shape of your heart and how  well your heart's chambers and valves are working. This procedure takes approximately one hour. There are no restrictions for this procedure.  Your physician has requested that you have en exercise stress myoview as soon as possible. For further information please visit [email or web address]. Please follow instruction sheet, as given.  Non-Cardiac CT Angiography (CTA), is a special type of CT scan that uses a computer to produce multi-dimensional views of major blood vessels throughout the body. In CT angiography, a contrast material is injected through an IV to help visualize the blood vessels  Follow-Up: Your physician wants you to follow-up AS NEEDED with Dr. Radford Pax.  Any Other Special Instructions Will Be Listed Below (If Applicable).   Thank you for choosing [facility], RN  [phone]  If you need a refill on your cardiac medications before your next appointment, please call your pharmacy.      Signed, Fransico Him, MD  01/23/2018 10:38 AM    [facility] [address], [place] [address], [place]  [address] Phone: [phone]; Fax: [phone]

## 2018-01-24 ENCOUNTER — Telehealth (HOSPITAL_COMMUNITY): Payer: Self-pay | Admitting: *Deleted

## 2018-01-24 NOTE — Telephone Encounter (Signed)
Left message on voicemail per DPR in reference to upcoming appointment scheduled on 01/25/18 with detailed instructions given per Myocardial Perfusion Study Information Sheet for the test. LM to arrive 15 minutes early, and that it is imperative to arrive on time for appointment to keep from having the test rescheduled. If you need to cancel or reschedule your appointment, please call the office within 24 hours of your appointment. Failure to do so may result in a cancellation of your appointment, and a $50 no show fee. Phone number given for call back for any questions. Kirstie Peri, RN

## 2018-01-25 ENCOUNTER — Other Ambulatory Visit: Payer: Self-pay

## 2018-01-25 ENCOUNTER — Ambulatory Visit (HOSPITAL_COMMUNITY): Payer: PPO | Attending: Cardiology

## 2018-01-25 ENCOUNTER — Ambulatory Visit (HOSPITAL_BASED_OUTPATIENT_CLINIC_OR_DEPARTMENT_OTHER): Payer: PPO

## 2018-01-25 VITALS — Ht 63.5 in | Wt 123.4 lb

## 2018-01-25 DIAGNOSIS — R29898 Other symptoms and signs involving the musculoskeletal system: Secondary | ICD-10-CM

## 2018-01-25 DIAGNOSIS — I10 Essential (primary) hypertension: Secondary | ICD-10-CM | POA: Diagnosis not present

## 2018-01-25 DIAGNOSIS — R6889 Other general symptoms and signs: Secondary | ICD-10-CM

## 2018-01-25 DIAGNOSIS — I083 Combined rheumatic disorders of mitral, aortic and tricuspid valves: Secondary | ICD-10-CM | POA: Insufficient documentation

## 2018-01-25 DIAGNOSIS — E785 Hyperlipidemia, unspecified: Secondary | ICD-10-CM | POA: Diagnosis not present

## 2018-01-25 DIAGNOSIS — E7800 Pure hypercholesterolemia, unspecified: Secondary | ICD-10-CM

## 2018-01-25 DIAGNOSIS — Z853 Personal history of malignant neoplasm of breast: Secondary | ICD-10-CM | POA: Diagnosis not present

## 2018-01-25 LAB — ECHOCARDIOGRAM COMPLETE
HEIGHTINCHES: 63.5 in
WEIGHTICAEL: 1974.4 [oz_av]

## 2018-01-25 LAB — MYOCARDIAL PERFUSION IMAGING
CHL CUP NUCLEAR SSS: 10
LHR: 0.28
LV dias vol: 48 mL (ref 46–106)
LV sys vol: 12 mL
NUC STRESS TID: 1.01
Peak HR: 96 {beats}/min
Rest HR: 75 {beats}/min
SDS: 4
SRS: 8

## 2018-01-25 MED ORDER — TECHNETIUM TC 99M TETROFOSMIN IV KIT
32.4000 | PACK | Freq: Once | INTRAVENOUS | Status: AC | PRN
  Administered 2018-01-25: 32.4 via INTRAVENOUS
  Filled 2018-01-25: qty 33

## 2018-01-25 MED ORDER — REGADENOSON 0.4 MG/5ML IV SOLN
0.4000 mg | Freq: Once | INTRAVENOUS | Status: AC
  Administered 2018-01-25: 0.4 mg via INTRAVENOUS

## 2018-01-25 MED ORDER — TECHNETIUM TC 99M TETROFOSMIN IV KIT
10.4000 | PACK | Freq: Once | INTRAVENOUS | Status: AC | PRN
  Administered 2018-01-25: 10.4 via INTRAVENOUS
  Filled 2018-01-25: qty 11

## 2018-01-26 ENCOUNTER — Telehealth: Payer: Self-pay | Admitting: Cardiology

## 2018-01-26 DIAGNOSIS — E785 Hyperlipidemia, unspecified: Secondary | ICD-10-CM

## 2018-01-26 NOTE — Telephone Encounter (Signed)
New message ° ° °Please call with stress test results °

## 2018-01-26 NOTE — Telephone Encounter (Signed)
Notes recorded by Teressa Senter, RN on 01/26/2018 at 5:16 PM EDT Pt ans spouse made aware CT showed mild atherosclerotic plaque in the thoracic aorta and small pulmonary nodules in the right upper and lower lobes. There is calcified plaque in the abdominal aorta up to 50% narrowing distally. Mild irregularity in the right renal artery with no setnosis c/w possible fibromuscular dysplasia. 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 Please have her come in to get an FLP and ALT. Please refer her to Dr. Donnetta Hutching or Scot Dock with Vascular surgery to follow along.  Pt scheduled for FLP and ALT on Monday morning. Referral placed for Dr. Donnetta Hutching or Dr. Scot Dock with vascular surgery. She stated understanding and thankful for the call Informed pt I will call back with echo and stress test results once they have been reviewed by Dr. Radford Pax.

## 2018-01-29 ENCOUNTER — Other Ambulatory Visit: Payer: PPO | Admitting: *Deleted

## 2018-01-29 DIAGNOSIS — E785 Hyperlipidemia, unspecified: Secondary | ICD-10-CM

## 2018-01-29 LAB — LIPID PANEL
CHOL/HDL RATIO: 2.2 ratio (ref 0.0–4.4)
CHOLESTEROL TOTAL: 206 mg/dL — AB (ref 100–199)
HDL: 95 mg/dL (ref >39)
LDL Calculated: 99 mg/dL (ref 0–99)
TRIGLYCERIDES: 58 mg/dL (ref 0–149)
VLDL Cholesterol Cal: 12 mg/dL (ref 5–40)

## 2018-01-29 LAB — ALT: ALT: 13 IU/L (ref 0–32)

## 2018-01-30 ENCOUNTER — Telehealth: Payer: Self-pay | Admitting: Cardiology

## 2018-01-30 DIAGNOSIS — R6889 Other general symptoms and signs: Secondary | ICD-10-CM

## 2018-01-30 DIAGNOSIS — E7841 Elevated Lipoprotein(a): Secondary | ICD-10-CM

## 2018-01-30 MED ORDER — METOPROLOL TARTRATE 50 MG PO TABS: 50.0000 mg | ORAL_TABLET | Freq: Two times a day (BID) | ORAL | 0 refills | Status: AC

## 2018-01-30 MED ORDER — SIMVASTATIN 20 MG PO TABS: 20.0000 mg | ORAL_TABLET | Freq: Every day | ORAL | 3 refills | Status: DC

## 2018-01-30 NOTE — Telephone Encounter (Signed)
I confirmed with Smitty Cords at [facility]. That pt should take lopressor 50 mg once 1 hour prior to coronary CT. She states she will make correction and fill for patient. She verbalized understanding and thankful for the call

## 2018-01-30 NOTE — Telephone Encounter (Signed)
New Message   Pt c/o medication issue:  1. Name of Medication: Metoprolol   2. How are you currently taking this medication (dosage and times per day)? Take 1 tablet (50 mg total) by mouth 2 (two) times daily. Take 1 hour prior to scheduled coronary CT.  3. Are you having a reaction (difficulty breathing--STAT)? no  4. What is your medication issue? Gaya at [facility] is calling ,states the qty that was sent over is wrong. Please call

## 2018-01-30 NOTE — Telephone Encounter (Signed)
New message    Patient is returning Samantha Bean call please call when you get the chance

## 2018-01-30 NOTE — Telephone Encounter (Signed)
Notes recorded by Teressa Senter, RN on 01/30/2018 at 2:43 PM EDT Pt made aware of lab results. Pt instructed to increase simvastatin to 20 mg daily and repeat FLP and ALT in 6 weeks, pt in agreement with treatment plan, pt schedule for repeat labs on 03/27/18. She stated understanding and thankful for the call.  Notes recorded by Sueanne Margarita, MD on 01/29/2018 at 5:16 PM EDT LDL goal < 70 due to diffuse atherosclerotic plaque in aorta. Increase simvastatin to 20mg  daily and check FLp and ALT in 6 weeks.  Notes recorded by Teressa Senter, RN on 01/30/2018 at 2:34 PM EDT Pt made aware of stress test results and Dr. Theodosia Blender recommendation for coronary CTA to rule out significant CAD. She is in agreement with plan. Instructions reviewed with pt. Informed pt she will need to schedule bmet within 7 days of CT scan. She stataed understanding and thankful for the call  Notes recorded by Sueanne Margarita, MD on 01/29/2018 at 6:19 PM EDT Nuclear stress test showed normal LV function with a small area of reversibility in the anterior apex and apical location. There is also a small defect in the basal inferoseptal mid inferior septal regions likely representing attenuation artifact but cannot rule out a very small area of ischemia. Overall this is a low risk study. I would like her to have a coronary CTA with FFR to rule out significant CAD.

## 2018-02-07 NOTE — Telephone Encounter (Signed)
error 

## 2018-02-08 ENCOUNTER — Telehealth: Payer: Self-pay | Admitting: Cardiology

## 2018-02-08 NOTE — Telephone Encounter (Signed)
Spoke with pt today to clarify her referral to vascular surgery and her instructions for her coronary CT, which has not been scheduled as of yet. Pt verbalized understanding of the need to see vascular surgery and her CT instructions. She had no additional questions.

## 2018-02-08 NOTE — Telephone Encounter (Signed)
Pt calling and stated she received paperwork to see Dr Donnetta Hutching and pt need to speak to nurse as to why she need to see Vein Specialist. Please call pt.

## 2018-02-09 ENCOUNTER — Telehealth: Payer: Self-pay | Admitting: Cardiology

## 2018-02-09 NOTE — Telephone Encounter (Signed)
New Message   Pt states she received a message on 6/18 from the nurse and she is returning the call. Please call

## 2018-02-09 NOTE — Telephone Encounter (Signed)
I spoke with patient. She states she received a voicemail stating to call back but she thinks it was old recording. Pt had no questions or concerns. I informed her to call back with any questions or concerns. She stated understanding and thankful for the call

## 2018-02-12 DIAGNOSIS — I10 Essential (primary) hypertension: Secondary | ICD-10-CM | POA: Diagnosis not present

## 2018-02-27 ENCOUNTER — Other Ambulatory Visit: Payer: Self-pay

## 2018-02-27 ENCOUNTER — Ambulatory Visit (INDEPENDENT_AMBULATORY_CARE_PROVIDER_SITE_OTHER): Payer: PPO | Admitting: Vascular Surgery

## 2018-02-27 ENCOUNTER — Encounter: Payer: Self-pay | Admitting: Vascular Surgery

## 2018-02-27 VITALS — BP 142/71 | HR 66 | Temp 98.3°F | Resp 16 | Ht 63.5 in | Wt 122.0 lb

## 2018-02-27 DIAGNOSIS — I350 Nonrheumatic aortic (valve) stenosis: Secondary | ICD-10-CM | POA: Diagnosis not present

## 2018-02-27 NOTE — Progress Notes (Signed)
[facility] NOTE   MRN : [id number]  Reason for Consult: aortic and renal artery calcification on CTA  Referring Physician: Dr. Golden Hurter  History of Present Illness: 82 y/o female was found to have calcified plaque involving her aorta, right renal arty on a CTA for work up of newly diagnosed hypertension.  She first noted urinary urgency at night and was seen by her primary care physician for possible UTI.Marland Kitchen She was diagnosed with hypertension and referred to cardiology for further work.      She is very active daily with 1 hour walks to total 4 miles.  She denise calf pain and no history of non healing wound.  Past medical history includes: hyperlipidemia, HTN, and breast cancer.      Current Outpatient Medications  Medication Sig Dispense Refill  . amLODipine (NORVASC) 5 MG tablet Take 5 mg by mouth daily.    Marland Kitchen BIOTIN PO Take 1 tablet by mouth daily.    Marland Kitchen CALCIUM PO Take 1 tablet by mouth daily.    . Cholecalciferol (VITAMIN D3 PO) Take 250 mg by mouth daily.    . hydrochlorothiazide (HYDRODIURIL) 12.5 MG tablet Take 2.5 mg by mouth daily.     . metoprolol tartrate (LOPRESSOR) 50 MG tablet Take 1 tablet (50 mg total) by mouth 2 (two) times daily. Take 1 hour prior to scheduled coronary CT. 1 tablet 0  . Multiple Vitamin (MULTIVITAMIN) tablet Take 1 tablet by mouth daily.    . simvastatin (ZOCOR) 20 MG tablet Take 1 tablet (20 mg total) by mouth at bedtime. 90 tablet 3   No current facility-administered medications for this visit.     Pt meds include: Statin :Yes Betablocker: Yes ASA: No Other anticoagulants/antiplatelets: none  Past Medical History:  Diagnosis Date  . Benign essential HTN   . Breast cancer ([place]) 2000   Left  . Hyperlipidemia   . Personal history of radiation therapy     Past Surgical History:  Procedure Laterality Date  . BREAST LUMPECTOMY Left 2000    Social History Social History   Tobacco Use  . Smoking  status: Never Smoker  . Smokeless tobacco: Never Used  Substance Use Topics  . Alcohol use: Not on file  . Drug use: Not on file    Family History Family History  Problem Relation Age of Onset  . Coronary artery disease Mother   . CAD Sister   . CAD Sister     No Known Allergies   REVIEW OF SYSTEMS  General: [ ]  Weight loss, [ ]  Fever, [ ]  chills Neurologic: [ ]  Dizziness, [ ]  Blackouts, [ ]  Seizure [ ]  Stroke, [ ]  "Mini stroke", [ ]  Slurred speech, [ ]  Temporary blindness; [ ]  weakness in arms or legs, [ ]  Hoarseness [ ]  Dysphagia Cardiac: [ ]  Chest pain/pressure, [ ]  Shortness of breath at rest [ ]  Shortness of breath with exertion, [ ]  Atrial fibrillation or irregular heartbeat  Vascular: [ ]  Pain in legs with walking, [ ]  Pain in legs at rest, [ ]  Pain in legs at night,  [ ]  Non-healing ulcer, [ ]  Blood clot in vein/DVT,   Pulmonary: [ ]  Home oxygen, [ ]  Productive cough, [ ]  Coughing up blood, [ ]  Asthma,  [ ]  Wheezing [ ]  COPD Musculoskeletal:  [ ]  Arthritis, [ ]  Low back pain, [ ]  Joint pain Hematologic: [ ]  Easy Bruising, [ ]  Anemia; [ ]  Hepatitis Gastrointestinal: [ ]   Blood in stool, [ ]  Gastroesophageal Reflux/heartburn, Urinary: [ ]  chronic Kidney disease, [ ]  on HD - [ ]  MWF or [ ]  TTHS, [ x] Burning with urination, [ ]  Difficulty urinating Skin: [ ]  Rashes, [ ]  Wounds Psychological: [ ]  Anxiety, [ ]  Depression  Physical Examination Vitals:   02/27/18 1426  BP: (!) 142/71  Pulse: 66  Resp: 16  Temp: 98.3 F (36.8 C)  TempSrc: Oral  SpO2: 96%  Weight: 122 lb (55.3 kg)  Height: 5' 3.5" (1.613 m)   Body mass index is 21.27 kg/m.  General:  WDWN in NAD Gait: Normal HENT: WNL, normocephalic Eyes: Pupils equal Pulmonary: normal non-labored breathing , without Rales, rhonchi,  wheezing Cardiac: RRR, without  Murmurs, rubs or gallops; No carotid bruits Abdomen: soft, NT, no masses. + BS Skin: no rashes, ulcers noted;  no Gangrene , no cellulitis; no open  wounds;   Vascular Exam/Pulses:Palpable radial, brachial, femoral, popliteal, DP/PT 2+ B LE   Musculoskeletal: no muscle wasting or atrophy; no edema, varicosities   Neurologic: A&O X 3; Appropriate Affect ;  SENSATION: normal; MOTOR FUNCTION: 5/5 Symmetric Speech is fluent/normal   Significant Diagnostic Studies: CBC Lab Results  Component Value Date   WBC 5.3 05/31/2011   HGB 14.1 05/31/2011   HCT 42.5 05/31/2011   MCV 86.2 05/31/2011   PLT 223 05/31/2011    BMET    Component Value Date/Time   NA 134 01/23/2018 1042   K 4.1 01/23/2018 1042   CL 98 01/23/2018 1042   CO2 31 (H) 01/23/2018 1042   GLUCOSE 93 01/23/2018 1042   GLUCOSE 95 05/31/2011 1858   BUN 19 01/23/2018 1042   CREATININE 0.85 01/23/2018 1042   CALCIUM 10.2 01/23/2018 1042   GFRNONAA 65 01/23/2018 1042   GFRAA 75 01/23/2018 1042   CrCl cannot be calculated (Patient's most recent lab result is older than the maximum 21 days allowed.).  COAG No results found for: INR, PROTIME   Non-Invasive Vascular Imaging:  CTA was reviewed with Dr. Donnetta Hutching Right renal " [place] of Pearls"  Fibromuscular dysplasia with patent arterial flow, Calcification plaque with 50% narrowing at the iliac bifurcation.  Moderate narrowing at the celiac origin widely patent  ASSESSMENT/PLAN:  Abnormal CTA with calcification without limiting arterial flow. She has normal palpable arterial flow to bilateral lower extremities.  She denise any signs or symptoms of claudication and walks daily for exercise.   No vascular intervention is indicated, and no f/u is recommended at this time.  F/U PRN.     Roxy Horseman 02/27/2018 3:25 PM  The patient was seen today in conjunction with Dr. Donnetta Hutching  I have examined the patient, reviewed and agree with above.  I discussed the incidental findings of her CTA with the patient and her husband present.  No evidence of significant renal artery stenosis that would suggest renovascular  hypertension.  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  Also has moderate to severe narrowing with calcification at her aortic bifurcation.  Despite this she has normal distal pulses and has no claudication symptoms.  Would not recommend any further evaluation or follow-up regarding these incidental findings. Curt Jews, MD 02/27/2018 4:14 PM

## 2018-03-13 ENCOUNTER — Telehealth: Payer: Self-pay

## 2018-03-13 ENCOUNTER — Ambulatory Visit (HOSPITAL_COMMUNITY)
Admission: RE | Admit: 2018-03-13 | Discharge: 2018-03-13 | Disposition: A | Discharge status: Home / Self Care | Payer: PPO | Source: Ambulatory Visit | Attending: Cardiology | Admitting: Cardiology

## 2018-03-13 ENCOUNTER — Ambulatory Visit (HOSPITAL_COMMUNITY): Payer: PPO

## 2018-03-13 DIAGNOSIS — R079 Chest pain, unspecified: Secondary | ICD-10-CM | POA: Diagnosis not present

## 2018-03-13 DIAGNOSIS — R6889 Other general symptoms and signs: Secondary | ICD-10-CM | POA: Diagnosis not present

## 2018-03-13 DIAGNOSIS — I700 Atherosclerosis of aorta: Secondary | ICD-10-CM | POA: Diagnosis not present

## 2018-03-13 LAB — POCT I-STAT CREATININE: CREATININE: 0.8 mg/dL (ref 0.44–1.00)

## 2018-03-13 MED ORDER — IOPAMIDOL (ISOVUE-370) INJECTION 76%
INTRAVENOUS | Status: AC
  Filled 2018-03-13: qty 100

## 2018-03-13 MED ORDER — NITROGLYCERIN 0.4 MG SL SUBL
0.8000 mg | SUBLINGUAL_TABLET | Freq: Once | SUBLINGUAL | Status: AC
  Administered 2018-03-13: 0.8 mg via SUBLINGUAL
  Filled 2018-03-13: qty 25

## 2018-03-13 MED ORDER — NITROGLYCERIN 0.4 MG SL SUBL
SUBLINGUAL_TABLET | SUBLINGUAL | Status: AC
  Filled 2018-03-13: qty 2

## 2018-03-13 MED ORDER — IOPAMIDOL (ISOVUE-370) INJECTION 76%
100.0000 mL | Freq: Once | INTRAVENOUS | Status: AC | PRN
  Administered 2018-03-13: 80 mL via INTRAVENOUS

## 2018-03-13 MED ORDER — ASPIRIN EC 81 MG PO TBEC: 81.0000 mg | DELAYED_RELEASE_TABLET | Freq: Every day | ORAL | 3 refills | Status: AC

## 2018-03-13 NOTE — Telephone Encounter (Signed)
Informed patient of results and verbal understanding expressed.   Patient is not currently taking ASA. Instructed her to take ASA 81 mg daily and to continue increased dose of statin.  Confirmed upcoming fasting lab appointment. She will call PCP to follow-up for neck pain. She was grateful for call and agrees with treatment plan.

## 2018-03-13 NOTE — Telephone Encounter (Signed)
-----   Message from Sueanne Margarita, MD sent at 03/13/2018  2:48 PM EDT ----- Please let patient know the coronary CTA showed low calcium score at 18 which is only 27 percentile for age and sex matched controls.  There was a <30% calcified plaque in the proximal mid LAD and less than 50% soft plaque in the proximal D2 otherwise normal coronary arteries.  Continue aspirin and statin.  She should have a fasting lipid panel pending soon after increasing dose of statin.

## 2018-03-15 ENCOUNTER — Other Ambulatory Visit: Payer: Self-pay | Admitting: Geriatric Medicine

## 2018-03-15 DIAGNOSIS — H539 Unspecified visual disturbance: Secondary | ICD-10-CM

## 2018-03-15 DIAGNOSIS — I10 Essential (primary) hypertension: Secondary | ICD-10-CM | POA: Diagnosis not present

## 2018-03-15 DIAGNOSIS — R269 Unspecified abnormalities of gait and mobility: Secondary | ICD-10-CM | POA: Diagnosis not present

## 2018-03-21 ENCOUNTER — Ambulatory Visit
Admission: RE | Admit: 2018-03-21 | Discharge: 2018-03-21 | Disposition: A | Discharge status: Home / Self Care | Payer: PPO | Source: Ambulatory Visit | Attending: Geriatric Medicine | Admitting: Geriatric Medicine

## 2018-03-21 DIAGNOSIS — H539 Unspecified visual disturbance: Secondary | ICD-10-CM

## 2018-03-21 MED ORDER — GADOBENATE DIMEGLUMINE 529 MG/ML IV SOLN
11.0000 mL | Freq: Once | INTRAVENOUS | Status: AC | PRN
  Administered 2018-03-21: 11 mL via INTRAVENOUS

## 2018-03-27 ENCOUNTER — Telehealth: Payer: Self-pay

## 2018-03-27 ENCOUNTER — Other Ambulatory Visit: Payer: PPO

## 2018-03-27 DIAGNOSIS — E7841 Elevated Lipoprotein(a): Secondary | ICD-10-CM | POA: Diagnosis not present

## 2018-03-27 LAB — LIPID PANEL
CHOL/HDL RATIO: 2.4 ratio (ref 0.0–4.4)
CHOLESTEROL TOTAL: 197 mg/dL (ref 100–199)
HDL: 83 mg/dL (ref >39)
LDL CALC: 100 mg/dL — AB (ref 0–99)
Triglycerides: 70 mg/dL (ref 0–149)
VLDL CHOLESTEROL CAL: 14 mg/dL (ref 5–40)

## 2018-03-27 LAB — ALT: ALT: 16 IU/L (ref 0–32)

## 2018-03-27 NOTE — Telephone Encounter (Signed)
Left message to call back  

## 2018-03-27 NOTE — Telephone Encounter (Signed)
-----   Message from Sueanne Margarita, MD sent at 03/27/2018  4:33 PM EDT ----- LDL not at goal on simvastatin 20 mg daily and she is also on amlodipine.  Please have her stop simvastatin and start Lipitor 40 mg daily and repeat FLP and ALT in 6 weeks

## 2018-03-28 ENCOUNTER — Other Ambulatory Visit: Payer: Self-pay

## 2018-03-28 DIAGNOSIS — E785 Hyperlipidemia, unspecified: Secondary | ICD-10-CM

## 2018-03-28 MED ORDER — ATORVASTATIN CALCIUM 40 MG PO TABS: 40.0000 mg | ORAL_TABLET | Freq: Every day | ORAL | 3 refills | Status: DC

## 2018-03-28 NOTE — Telephone Encounter (Signed)
Spoke with patient with results/recommendations.  She verbalized understanding.

## 2018-04-09 DIAGNOSIS — I10 Essential (primary) hypertension: Secondary | ICD-10-CM | POA: Diagnosis not present

## 2018-04-09 DIAGNOSIS — G3184 Mild cognitive impairment, so stated: Secondary | ICD-10-CM | POA: Diagnosis not present

## 2018-04-09 DIAGNOSIS — R351 Nocturia: Secondary | ICD-10-CM | POA: Diagnosis not present

## 2018-04-09 DIAGNOSIS — F419 Anxiety disorder, unspecified: Secondary | ICD-10-CM | POA: Diagnosis not present

## 2018-05-09 ENCOUNTER — Other Ambulatory Visit: Payer: PPO

## 2018-05-10 ENCOUNTER — Other Ambulatory Visit: Payer: PPO | Admitting: *Deleted

## 2018-05-10 DIAGNOSIS — E785 Hyperlipidemia, unspecified: Secondary | ICD-10-CM | POA: Diagnosis not present

## 2018-05-10 LAB — LIPID PANEL
CHOLESTEROL TOTAL: 192 mg/dL (ref 100–199)
Chol/HDL Ratio: 2 ratio (ref 0.0–4.4)
HDL: 94 mg/dL (ref >39)
LDL CALC: 86 mg/dL (ref 0–99)
Triglycerides: 60 mg/dL (ref 0–149)
VLDL CHOLESTEROL CAL: 12 mg/dL (ref 5–40)

## 2018-05-10 LAB — ALT: ALT: 17 IU/L (ref 0–32)

## 2018-05-14 DIAGNOSIS — I10 Essential (primary) hypertension: Secondary | ICD-10-CM | POA: Diagnosis not present

## 2018-05-14 DIAGNOSIS — R202 Paresthesia of skin: Secondary | ICD-10-CM | POA: Diagnosis not present

## 2018-05-14 DIAGNOSIS — R609 Edema, unspecified: Secondary | ICD-10-CM | POA: Diagnosis not present

## 2018-05-14 DIAGNOSIS — Z23 Encounter for immunization: Secondary | ICD-10-CM | POA: Diagnosis not present

## 2018-05-24 DIAGNOSIS — R9389 Abnormal findings on diagnostic imaging of other specified body structures: Secondary | ICD-10-CM | POA: Diagnosis not present

## 2018-07-02 DIAGNOSIS — E785 Hyperlipidemia, unspecified: Secondary | ICD-10-CM | POA: Diagnosis not present

## 2018-07-02 DIAGNOSIS — I10 Essential (primary) hypertension: Secondary | ICD-10-CM | POA: Diagnosis not present

## 2018-08-25 IMAGING — CT CT ANGIO ABDOMEN
3 of 10 series · 7 of 46 positions shown, 12 images · IV contrast (ISOVUE 370)
Comparison: CT abdomen and pelvis - 12/29/2016

CLINICAL DATA: Renal artery bruits. New diagnosis of hypertension.
Evaluate for renal artery stenosis.

EXAM:
CT ANGIOGRAPHY CHEST AND ABDOMEN
TECHNIQUE: Multidetector CT imaging through the chest and abdomen was performed
using the standard protocol during bolus administration of
intravenous contrast. Multiplanar reconstructed images and MIPs were
obtained and reviewed to evaluate the vascular anatomy.
CONTRAST:  100mL 19H9LB-FFW IOPAMIDOL (19H9LB-FFW) INJECTION 76%

[Series 4: arterial 3.0 i40f 2 · axial · arterial · 0.63mm/px · z∈[-358,-304]mm · 2 of 131 slices shown]
[im 10/131  soft-tissue]
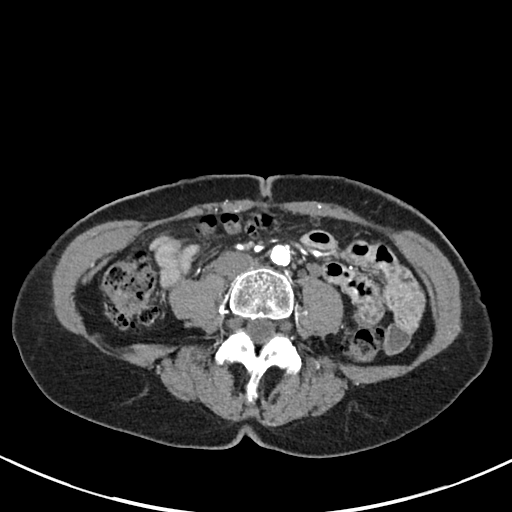
[im 28/131  soft-tissue]
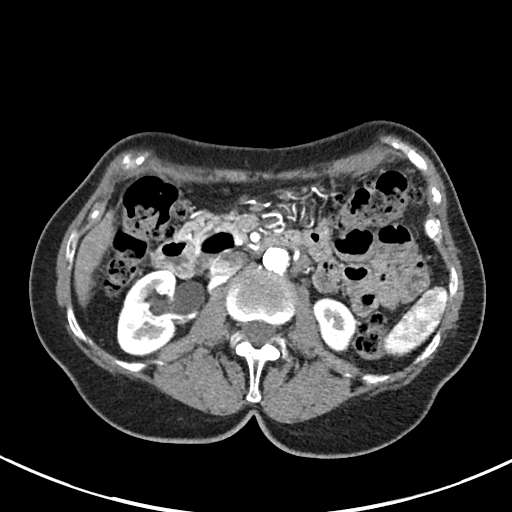

[Series 7: venous 5.0 i40f 2 · axial · portal-venous · 0.65mm/px · z∈[-326,-226]mm · 3 of 41 slices shown, 7 images]
[im 11/41  soft-tissue]
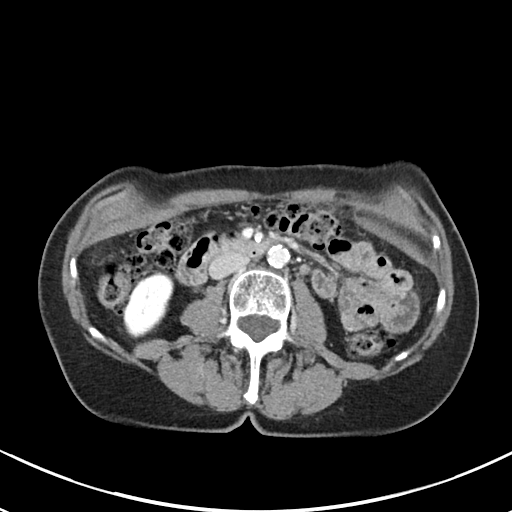
[im 11/41  lung]
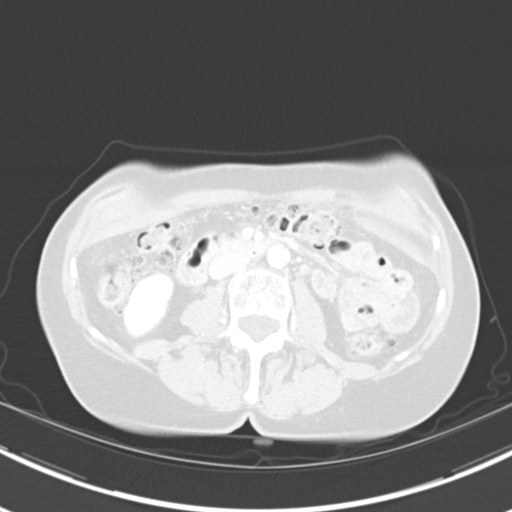
[im 11/41  bone]
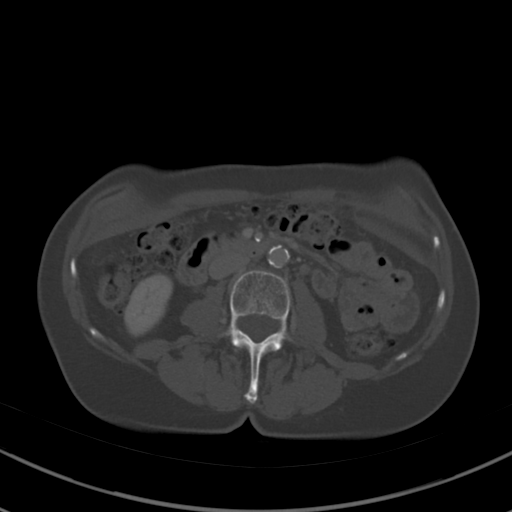
[im 21/41  soft-tissue]
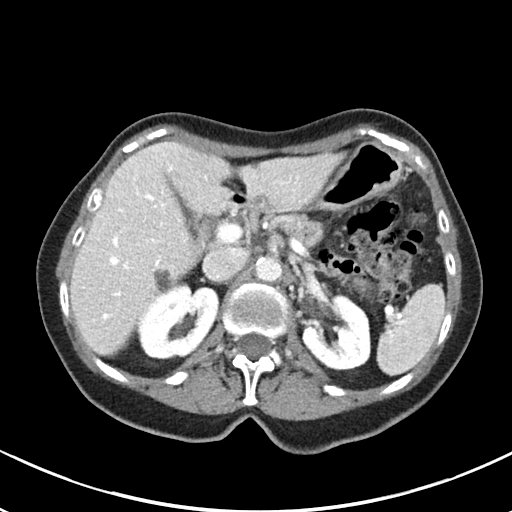
[im 21/41  lung]
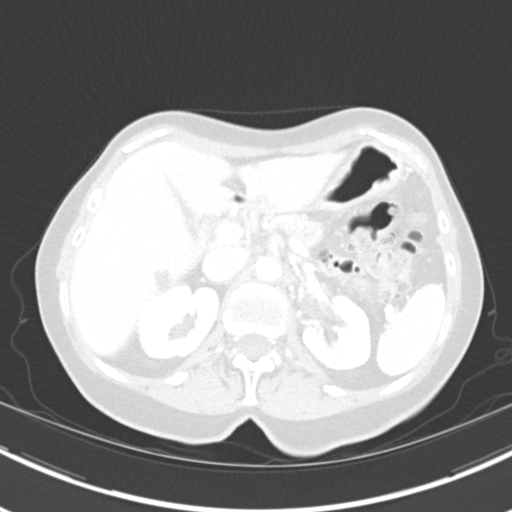
[im 31/41  soft-tissue]
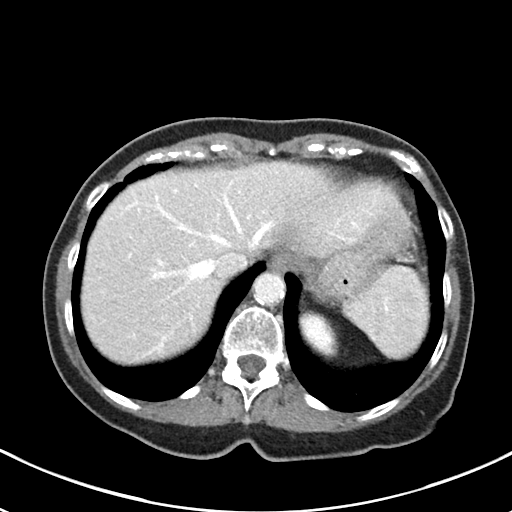
[im 31/41  lung]
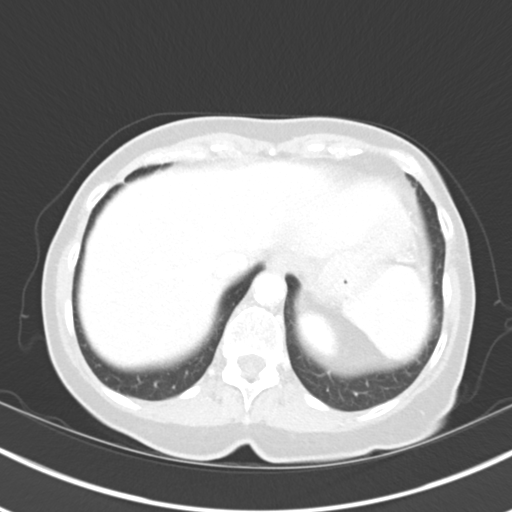

[Series 10: coronals · coronal · 0.59mm/px · 2 of 102 slices shown, 3 images]
[im 34/102  soft-tissue]
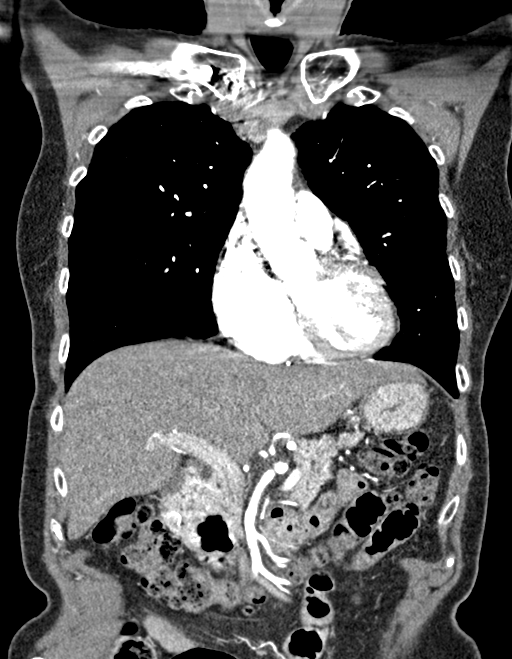
[im 34/102  bone]
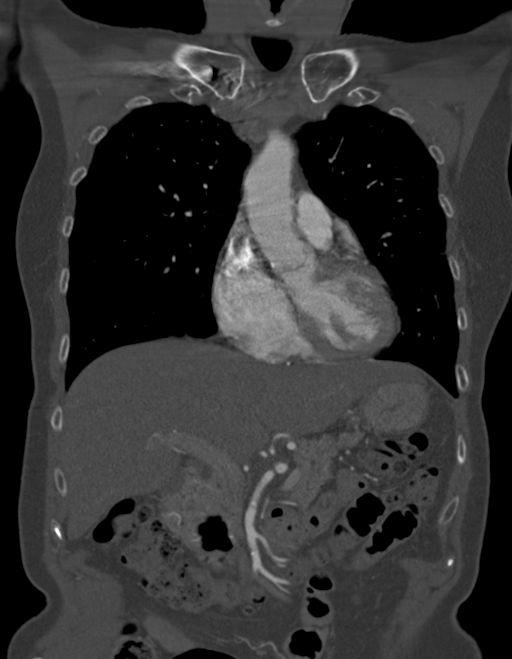
[im 68/102  soft-tissue]
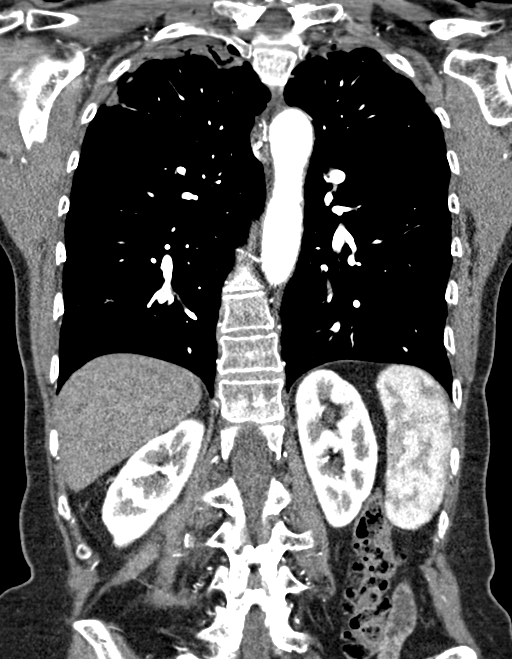

[7 of 46 positions shown; findings below may reference images not displayed]

FINDINGS: CTA CHEST FINDINGS

Vascular Findings:

Scattered mixed calcified and noncalcified atherosclerotic plaque
with a normal caliber thoracic aorta, not resulting in a
hemodynamically significant stenosis. No evidence of thoracic aortic
dissection or periaortic stranding on this nongated examination.

Conventional configuration of the aortic arch. Atherosclerotic
plaque involves the origin of the branch vessel aortic arch however
the branch vessels appear patent throughout their imaged course.

Normal heart size.  No pericardial effusion.

Although this examination was not tailored for the evaluation the
pulmonary arteries, there are no discrete filling defects within the
central pulmonary arterial tree to suggest central pulmonary
embolism. Normal caliber of the main pulmonary artery.

-------------------------------------------------------------

Thoracic aortic measurements:

Sinotubular junction

23 mm as measured in greatest oblique coronal dimension.

Proximal ascending aorta

27 mm as measured in greatest oblique axial dimension at the level
of the main pulmonary artery.

Aortic arch aorta

24 mm as measured in greatest oblique sagittal dimension.

Proximal descending thoracic aorta

23 mm as measured in greatest oblique axial dimension at the level
of the main pulmonary artery.

Distal descending thoracic aorta

22 mm as measured in greatest oblique axial dimension at the level
of the diaphragmatic hiatus.

Review of the MIP images confirms the above findings.

-------------------------------------------------------------

Non-Vascular Findings:

Mediastinum/Lymph Nodes: Scattered mediastinal lymph nodes are not
enlarged by size criteria with index right suprahilar lymph node
measures 0.7 cm in greatest short axis diameter. No bulky
mediastinal, hilar axillary lymphadenopathy.

Lungs/Pleura: Biapical slightly asymmetric pleuroparenchymal
thickening, right greater than left. Scattered subpleural areas of
reticulation are seen, most conspicuous about the lateral aspect of
the superior segment of the left lower lobe (image 26, series 6 as
well as the subpleural aspect of the left upper lobe (image 41,
series 6), likely atelectasis or scar. No discrete focal airspace
opacities. No pleural effusion or pneumothorax. The central
pulmonary airways appear patent.

Note is made of a punctate granuloma within the right lower lobe
(image 42, series 6). Scattered ill-defined tree-in-bud opacities
are seen within the peripheral aspect of the left lower lobe (image
49, series 6). Punctate (approximately 4 mm) nodules are seen within
the right lower (image 45, series 6) and right upper (image 45,
series 6) lobes.

Musculoskeletal: No acute or aggressive osseous abnormalities.
Regional soft tissues appear normal. Normal appearance of the
thyroid gland.

ABDOMINAL CTA FINDINGS

VASCULAR

Aorta: Moderate to large amount of mixed calcified and noncalcified
atherosclerotic plaque throughout the abdominal aorta. Eccentric
mixed calcified and noncalcified atherosclerotic plaque within
distal aspect the abdominal aorta caudal to the take-off of the IMA
approaches 50% luminal narrowing (image 123, series 4). No evidence
abdominal dissection or periaortic stranding.

Celiac: There is a moderate amount of eccentric mixed calcified and
noncalcified atherosclerotic plaque involving the origin of the
celiac artery resulting in approximately 50% luminal narrowing
(sagittal image 79, series 11). Conventional branching pattern.

SMA: There is a minimal amount of calcified atherosclerotic plaque
involving the cranial aspect of the origin of the SMA, not resulting
in a hemodynamically significant stenosis. The branch vessels of the
SMA appear widely patent without discrete intraluminal filling
defect to suggest distal embolism. Note is made of several
hypertrophied pancreaticoduodenal collaterals, likely contributing
to the celiac arterial vascular distribution.

Renals: Left renal artery is duplicated. There is an accessory left
renal artery which supplies the inferior pole of the left kidney.
There is a minimal amount of mixed calcified and noncalcified
atherosclerotic plaque involving the origin of the dominant left and
solitary right renal arteries, not resulting in a hemodynamically
significant stenosis. There is minimal apparent medial irregularity
involving the proximal aspect of the right renal artery (coronal
image 41, series 10 as could be seen in the setting of FMD. The
dominant left renal artery is without evidence of vessel
irregularity.

IMA: Remains patent throughout its imaged course.

Inflow: Minimal amount of calcified atherosclerotic plaque within
the imaged proximal aspects of the bilateral common iliac arteries.

Veins: IVC appears widely patent.

Review of the MIP images confirms the above findings.

NON-VASCULAR

Hepatobiliary: Normal hepatic contour. There is an approximately
cm hypoattenuating cyst within subcapsular aspect the right lobe of
the liver (21, series 7). Additional subcentimeter hypoattenuating
hepatic lesions are too small to adequately characterize though
favored to represent additional hepatic cysts. Normal appearance of
the gallbladder given degree distention. No intra extrahepatic bili
duct dilatation. No ascites.

Pancreas: Normal appearance of the pancreas

Spleen: Normal appearance of the spleen

Adrenals/Urinary Tract: There is symmetric enhancement of the
bilateral kidneys. No definite renal stones on this postcontrast
examination. There is a punctate (approximately 0.5 cm)
hypoattenuating lesion within the dome of the right kidney which is
too small to characterize of favored to represent renal cysts. Note
is made of mild bilateral extrarenal pelves, right greater than
left. No associated pelvicaliectasis. No urinary obstruction or
perinephric stranding.

Normal appearance the bilateral adrenal glands. The urinary bladder
was not imaged.

Stomach/Bowel: Moderate colonic stool burden without evidence of
enteric obstruction. Note is made of a duodenal diverticulum
involving the junction of the descending and horizontal segments of
the duodenum. No pneumoperitoneum, pneumatosis or portal venous gas.

Lymphatic: No bulky retroperitoneal, mesenteric or porta hepatis
adenopathy.

Other: Regional soft tissues appear normal.

Musculoskeletal: Grade 1 anterolisthesis of L4 upon L5 measuring
approximately 3 mm without associated pars defect. Bilateral facet
degenerative change within the lower lumbar spine.

Review of the MIP images confirms the above findings.
IMPRESSION: Chest CTA impression:

1. Scattered atherosclerotic plaque within normal caliber thoracic
aorta. No evidence of thoracic aortic dissection or periaortic
stranding on this nongated examination. Aortic Atherosclerosis
(58BUH-08A.A).
2. Punctate (approximately 4 mm) right upper and lower lobe
pulmonary nodules. No follow-up needed if patient is low-risk (and
has no known or suspected primary neoplasm). Non-contrast chest CT
can be considered in 12 months if patient is high-risk. This
recommendation follows the consensus statement: Guidelines for
Management of Incidental Pulmonary Nodules Detected on CT Images:

Abdominal CTA impression:

1. Large amount of slightly irregular mixed calcified and
noncalcified atherosclerotic plaque throughout the abdominal aorta
approaching 50% luminal narrowing at its distal aspect. No evidence
of abdominal aortic dissection or periaortic stranding. Aortic
Atherosclerosis (58BUH-08A.A).
2. No evidence of renal artery stenosis, however there is mild
beaded irregularity involving the proximal aspect of the right renal
artery as could be seen in the setting of FMD. No evidence of end
organ ischemia or asymmetric renal atrophy.
3. Suspected hemodynamically significant narrowing involving the
origin the celiac artery. The SMA and IMA both appear patent without
evidence of a hemodynamically significant narrowing.

## 2018-08-28 ENCOUNTER — Other Ambulatory Visit: Payer: Self-pay | Admitting: Geriatric Medicine

## 2018-08-28 DIAGNOSIS — Z1231 Encounter for screening mammogram for malignant neoplasm of breast: Secondary | ICD-10-CM

## 2018-08-30 ENCOUNTER — Ambulatory Visit
Admission: RE | Admit: 2018-08-30 | Discharge: 2018-08-30 | Disposition: A | Discharge status: Home / Self Care | Payer: PPO | Source: Ambulatory Visit | Attending: Geriatric Medicine | Admitting: Geriatric Medicine

## 2018-08-30 DIAGNOSIS — Z1231 Encounter for screening mammogram for malignant neoplasm of breast: Secondary | ICD-10-CM

## 2018-09-11 DIAGNOSIS — I10 Essential (primary) hypertension: Secondary | ICD-10-CM | POA: Diagnosis not present

## 2018-09-11 DIAGNOSIS — E785 Hyperlipidemia, unspecified: Secondary | ICD-10-CM | POA: Diagnosis not present

## 2018-09-19 DIAGNOSIS — E7800 Pure hypercholesterolemia, unspecified: Secondary | ICD-10-CM | POA: Diagnosis not present

## 2018-09-19 DIAGNOSIS — Z1389 Encounter for screening for other disorder: Secondary | ICD-10-CM | POA: Diagnosis not present

## 2018-09-19 DIAGNOSIS — Z1331 Encounter for screening for depression: Secondary | ICD-10-CM | POA: Diagnosis not present

## 2018-09-19 DIAGNOSIS — Z0000 Encounter for general adult medical examination without abnormal findings: Secondary | ICD-10-CM | POA: Diagnosis not present

## 2018-09-19 DIAGNOSIS — Z79899 Other long term (current) drug therapy: Secondary | ICD-10-CM | POA: Diagnosis not present

## 2018-09-19 DIAGNOSIS — I10 Essential (primary) hypertension: Secondary | ICD-10-CM | POA: Diagnosis not present

## 2018-10-01 DIAGNOSIS — I10 Essential (primary) hypertension: Secondary | ICD-10-CM | POA: Diagnosis not present

## 2018-10-01 DIAGNOSIS — E785 Hyperlipidemia, unspecified: Secondary | ICD-10-CM | POA: Diagnosis not present

## 2018-10-31 DIAGNOSIS — H5212 Myopia, left eye: Secondary | ICD-10-CM | POA: Diagnosis not present

## 2018-10-31 DIAGNOSIS — H5201 Hypermetropia, right eye: Secondary | ICD-10-CM | POA: Diagnosis not present

## 2018-10-31 DIAGNOSIS — H04129 Dry eye syndrome of unspecified lacrimal gland: Secondary | ICD-10-CM | POA: Diagnosis not present

## 2018-10-31 DIAGNOSIS — Z961 Presence of intraocular lens: Secondary | ICD-10-CM | POA: Diagnosis not present

## 2018-10-31 DIAGNOSIS — H524 Presbyopia: Secondary | ICD-10-CM | POA: Diagnosis not present

## 2018-10-31 DIAGNOSIS — H52223 Regular astigmatism, bilateral: Secondary | ICD-10-CM | POA: Diagnosis not present

## 2018-11-06 DIAGNOSIS — E785 Hyperlipidemia, unspecified: Secondary | ICD-10-CM | POA: Diagnosis not present

## 2018-11-06 DIAGNOSIS — I10 Essential (primary) hypertension: Secondary | ICD-10-CM | POA: Diagnosis not present

## 2018-11-29 DIAGNOSIS — E785 Hyperlipidemia, unspecified: Secondary | ICD-10-CM | POA: Diagnosis not present

## 2018-11-29 DIAGNOSIS — I10 Essential (primary) hypertension: Secondary | ICD-10-CM | POA: Diagnosis not present

## 2018-11-29 DIAGNOSIS — E7800 Pure hypercholesterolemia, unspecified: Secondary | ICD-10-CM | POA: Diagnosis not present

## 2018-12-27 DIAGNOSIS — E7800 Pure hypercholesterolemia, unspecified: Secondary | ICD-10-CM | POA: Diagnosis not present

## 2018-12-27 DIAGNOSIS — I10 Essential (primary) hypertension: Secondary | ICD-10-CM | POA: Diagnosis not present

## 2018-12-27 DIAGNOSIS — E785 Hyperlipidemia, unspecified: Secondary | ICD-10-CM | POA: Diagnosis not present

## 2019-01-24 DIAGNOSIS — E7800 Pure hypercholesterolemia, unspecified: Secondary | ICD-10-CM | POA: Diagnosis not present

## 2019-01-24 DIAGNOSIS — I10 Essential (primary) hypertension: Secondary | ICD-10-CM | POA: Diagnosis not present

## 2019-01-24 DIAGNOSIS — E785 Hyperlipidemia, unspecified: Secondary | ICD-10-CM | POA: Diagnosis not present

## 2019-01-31 DIAGNOSIS — R9389 Abnormal findings on diagnostic imaging of other specified body structures: Secondary | ICD-10-CM | POA: Diagnosis not present

## 2019-03-26 DIAGNOSIS — E785 Hyperlipidemia, unspecified: Secondary | ICD-10-CM | POA: Diagnosis not present

## 2019-03-26 DIAGNOSIS — E7800 Pure hypercholesterolemia, unspecified: Secondary | ICD-10-CM | POA: Diagnosis not present

## 2019-03-26 DIAGNOSIS — I10 Essential (primary) hypertension: Secondary | ICD-10-CM | POA: Diagnosis not present

## 2019-05-07 DIAGNOSIS — E7800 Pure hypercholesterolemia, unspecified: Secondary | ICD-10-CM | POA: Diagnosis not present

## 2019-05-07 DIAGNOSIS — R350 Frequency of micturition: Secondary | ICD-10-CM | POA: Diagnosis not present

## 2019-05-07 DIAGNOSIS — E785 Hyperlipidemia, unspecified: Secondary | ICD-10-CM | POA: Diagnosis not present

## 2019-05-07 DIAGNOSIS — R202 Paresthesia of skin: Secondary | ICD-10-CM | POA: Diagnosis not present

## 2019-05-07 DIAGNOSIS — I10 Essential (primary) hypertension: Secondary | ICD-10-CM | POA: Diagnosis not present

## 2019-05-23 DIAGNOSIS — I10 Essential (primary) hypertension: Secondary | ICD-10-CM | POA: Diagnosis not present

## 2019-05-23 DIAGNOSIS — Z23 Encounter for immunization: Secondary | ICD-10-CM | POA: Diagnosis not present

## 2019-06-03 DIAGNOSIS — E7800 Pure hypercholesterolemia, unspecified: Secondary | ICD-10-CM | POA: Diagnosis not present

## 2019-06-03 DIAGNOSIS — I10 Essential (primary) hypertension: Secondary | ICD-10-CM | POA: Diagnosis not present

## 2019-06-03 DIAGNOSIS — E785 Hyperlipidemia, unspecified: Secondary | ICD-10-CM | POA: Diagnosis not present

## 2019-06-27 DIAGNOSIS — E7800 Pure hypercholesterolemia, unspecified: Secondary | ICD-10-CM | POA: Diagnosis not present

## 2019-06-27 DIAGNOSIS — E785 Hyperlipidemia, unspecified: Secondary | ICD-10-CM | POA: Diagnosis not present

## 2019-06-27 DIAGNOSIS — I10 Essential (primary) hypertension: Secondary | ICD-10-CM | POA: Diagnosis not present

## 2019-07-29 ENCOUNTER — Other Ambulatory Visit: Payer: Self-pay | Admitting: Geriatric Medicine

## 2019-07-29 DIAGNOSIS — Z1231 Encounter for screening mammogram for malignant neoplasm of breast: Secondary | ICD-10-CM

## 2019-08-02 ENCOUNTER — Other Ambulatory Visit: Payer: Self-pay

## 2019-08-02 DIAGNOSIS — Z20822 Contact with and (suspected) exposure to covid-19: Secondary | ICD-10-CM

## 2019-08-03 LAB — NOVEL CORONAVIRUS, NAA: SARS-CoV-2, NAA: NOT DETECTED

## 2019-08-06 ENCOUNTER — Telehealth: Payer: Self-pay

## 2019-08-06 NOTE — Telephone Encounter (Signed)
Given COVID 19 results, verbalizes understanding. 

## 2019-08-13 ENCOUNTER — Other Ambulatory Visit: Payer: Self-pay

## 2019-08-13 DIAGNOSIS — R202 Paresthesia of skin: Secondary | ICD-10-CM

## 2019-08-20 DIAGNOSIS — I10 Essential (primary) hypertension: Secondary | ICD-10-CM | POA: Diagnosis not present

## 2019-08-20 DIAGNOSIS — E7800 Pure hypercholesterolemia, unspecified: Secondary | ICD-10-CM | POA: Diagnosis not present

## 2019-08-20 DIAGNOSIS — E785 Hyperlipidemia, unspecified: Secondary | ICD-10-CM | POA: Diagnosis not present

## 2019-08-28 ENCOUNTER — Ambulatory Visit: Payer: PPO | Admitting: Neurology

## 2019-08-28 ENCOUNTER — Other Ambulatory Visit: Payer: Self-pay

## 2019-09-11 ENCOUNTER — Ambulatory Visit (INDEPENDENT_AMBULATORY_CARE_PROVIDER_SITE_OTHER): Payer: PPO | Admitting: Neurology

## 2019-09-11 ENCOUNTER — Other Ambulatory Visit: Payer: Self-pay

## 2019-09-11 DIAGNOSIS — G629 Polyneuropathy, unspecified: Secondary | ICD-10-CM

## 2019-09-11 DIAGNOSIS — R202 Paresthesia of skin: Secondary | ICD-10-CM | POA: Diagnosis not present

## 2019-09-11 NOTE — Procedures (Signed)
[facility]  [address], [place] [address]  [place], [place] [address] Tel: [phone] Fax:  [phone] Test Date:  09/11/2019  Patient: Samantha Bean DOB: 08-Aug-1936 Physician: Narda Amber, DO  Sex: Female Height: 5\' 3"  Ref Phys: Lajean Manes, MD  ID#: [id number] Temp: 32.0C Technician:    Patient Complaints: This is a 84 year old female referred for evaluation of bilateral feet numbness and tingling.  NCV & EMG Findings: Extensive electrodiagnostic testing of the right lower extremity and additional studies of the left shows:  1. Bilateral superficial peroneal sensory responses show reduced amplitude (L2.3, R2.3 V).  Bilateral sural sensory responses show borderline normal amplitudes.   2. Bilateral peroneal and tibial motor responses are within normal limits.   3. Bilateral tibial H reflex studies are within normal limits.   4. Chronic motor axonal loss changes are isolated to the flexor digitorum longus muscles bilaterally, without accompanied active denervation.    Impression: The electrophysiologic findings are consistent with a chronic sensorimotor axonal polyneuropathy affecting the lower extremities.  Overall, these findings are mild in degree electrically.   ___________________________ Narda Amber, DO    Nerve Conduction Studies Anti Sensory Summary Table   Site NR Peak (ms) Norm Peak (ms) P-T Amp (V) Norm P-T Amp  Left Sup Peroneal Anti Sensory (Ant Lat Mall)  32C  12 cm    2.4 <4.6 2.3 >3  Right Sup Peroneal Anti Sensory (Ant Lat Mall)  32C  12 cm    2.7 <4.6 2.3 >3  Left Sural Anti Sensory (Lat Mall)  32C  Calf    4.0 <4.6 3.3 >3  Right Sural Anti Sensory (Lat Mall)  32C  Calf    3.3 <4.6 3.8 >3   Motor Summary Table   Site NR Onset (ms) Norm Onset (ms) O-P Amp (mV) Norm O-P Amp Site1 Site2 Delta-0 (ms) Dist (cm) Vel (m/s) Norm Vel (m/s)  Left Peroneal Motor (Ext Dig Brev)  32C  Ankle    3.8 <6.0 2.7 >2.5 B Fib Ankle 8.1 36.0 44 >40    B Fib    11.9  2.1  Poplt B Fib 1.6 7.0 44 >40  Poplt    13.5  2.0         Right Peroneal Motor (Ext Dig Brev)  32C  Ankle    3.2 <6.0 4.0 >2.5 B Fib Ankle 7.9 32.0 41 >40  B Fib    11.1  3.3  Poplt B Fib 1.6 7.0 44 >40  Poplt    12.7  3.0         Left Tibial Motor (Abd Hall Brev)  32C  Ankle    5.2 <6.0 5.8 >4 Knee Ankle 9.2 41.0 45 >40  Knee    14.4  4.4         Right Tibial Motor (Abd Hall Brev)  32C  Ankle    4.2 <6.0 5.3 >4 Knee Ankle 9.4 38.0 40 >40  Knee    13.6  4.1          H Reflex Studies   NR H-Lat (ms) Lat Norm (ms) L-R H-Lat (ms)  Left Tibial (Gastroc)  32C     33.88 <35 0.95  Right Tibial (Gastroc)  32C     34.83 <35 0.95   EMG   Side Muscle Ins Act Fibs Psw Fasc Number Recrt Dur Dur. Amp Amp. Poly Poly. Comment  Right AntTibialis Nml Nml Nml Nml Nml Nml Nml Nml Nml Nml Nml Nml N/A  Right Gastroc Nml Nml Nml Nml Nml Nml Nml Nml Nml Nml Nml Nml N/A  Right Flex Dig Long Nml Nml Nml Nml 1- Rapid Some 1+ Some 1+ Some 1+ N/A  Right RectFemoris Nml Nml Nml Nml Nml Nml Nml Nml Nml Nml Nml Nml N/A  Right GluteusMed Nml Nml Nml Nml Nml Nml Nml Nml Nml Nml Nml Nml N/A  Left BicepsFemS Nml Nml Nml Nml Nml Nml Nml Nml Nml Nml Nml Nml N/A  Right BicepsFemS Nml Nml Nml Nml Nml Nml Nml Nml Nml Nml Nml Nml N/A  Left AntTibialis Nml Nml Nml Nml Nml Nml Nml Nml Nml Nml Nml Nml N/A  Left Gastroc Nml Nml Nml Nml Nml Nml Nml Nml Nml Nml Nml Nml N/A  Left Flex Dig Long Nml Nml Nml Nml 1- Rapid Some 1+ Some 1+ Some 1+ N/A  Left RectFemoris Nml Nml Nml Nml Nml Nml Nml Nml Nml Nml Nml Nml N/A  Left GluteusMed Nml Nml Nml Nml Nml Nml Nml Nml Nml Nml Nml Nml N/A      Waveforms:

## 2019-09-16 DIAGNOSIS — E785 Hyperlipidemia, unspecified: Secondary | ICD-10-CM | POA: Diagnosis not present

## 2019-09-16 DIAGNOSIS — I10 Essential (primary) hypertension: Secondary | ICD-10-CM | POA: Diagnosis not present

## 2019-09-17 ENCOUNTER — Ambulatory Visit: Payer: PPO

## 2019-09-19 ENCOUNTER — Ambulatory Visit: Payer: PPO

## 2019-09-20 ENCOUNTER — Ambulatory Visit
Admission: RE | Admit: 2019-09-20 | Discharge: 2019-09-20 | Disposition: A | Discharge status: Home / Self Care | Payer: PPO | Source: Ambulatory Visit | Attending: Geriatric Medicine | Admitting: Geriatric Medicine

## 2019-09-20 ENCOUNTER — Other Ambulatory Visit: Payer: Self-pay

## 2019-09-20 DIAGNOSIS — Z1231 Encounter for screening mammogram for malignant neoplasm of breast: Secondary | ICD-10-CM | POA: Diagnosis not present

## 2019-09-26 ENCOUNTER — Ambulatory Visit: Payer: PPO | Attending: Internal Medicine

## 2019-09-26 DIAGNOSIS — Z23 Encounter for immunization: Secondary | ICD-10-CM

## 2019-09-26 NOTE — Progress Notes (Signed)
   Covid-19 Vaccination Clinic  Name:  Samantha Bean    MRN: [id number] DOB: 07/26/1936  09/26/2019  [name] was observed post Covid-19 immunization for 15 minutes without incidence. She was provided with Vaccine Information Sheet and instruction to access the V-Safe system.   [name] was instructed to call 911 with any severe reactions post vaccine: Marland Kitchen Difficulty breathing  . Swelling of your face and throat  . A fast heartbeat  . A bad rash all over your body  . Dizziness and weakness    Immunizations Administered    Name Date Dose VIS Date Route   Pfizer COVID-19 Vaccine 09/26/2019 12:52 PM 0.3 mL 08/02/2019 Intramuscular   Manufacturer: [place]   Lot: [id number]   [place]: [id number]

## 2019-10-01 DIAGNOSIS — E7800 Pure hypercholesterolemia, unspecified: Secondary | ICD-10-CM | POA: Diagnosis not present

## 2019-10-01 DIAGNOSIS — G609 Hereditary and idiopathic neuropathy, unspecified: Secondary | ICD-10-CM | POA: Diagnosis not present

## 2019-10-01 DIAGNOSIS — Z0000 Encounter for general adult medical examination without abnormal findings: Secondary | ICD-10-CM | POA: Diagnosis not present

## 2019-10-01 DIAGNOSIS — I700 Atherosclerosis of aorta: Secondary | ICD-10-CM | POA: Diagnosis not present

## 2019-10-01 DIAGNOSIS — Z1389 Encounter for screening for other disorder: Secondary | ICD-10-CM | POA: Diagnosis not present

## 2019-10-01 DIAGNOSIS — Z79899 Other long term (current) drug therapy: Secondary | ICD-10-CM | POA: Diagnosis not present

## 2019-10-01 DIAGNOSIS — I10 Essential (primary) hypertension: Secondary | ICD-10-CM | POA: Diagnosis not present

## 2019-10-17 DIAGNOSIS — E785 Hyperlipidemia, unspecified: Secondary | ICD-10-CM | POA: Diagnosis not present

## 2019-10-17 DIAGNOSIS — I10 Essential (primary) hypertension: Secondary | ICD-10-CM | POA: Diagnosis not present

## 2019-10-22 ENCOUNTER — Ambulatory Visit: Payer: PPO | Attending: Internal Medicine

## 2019-10-22 DIAGNOSIS — Z23 Encounter for immunization: Secondary | ICD-10-CM | POA: Insufficient documentation

## 2019-10-22 NOTE — Progress Notes (Signed)
   Covid-19 Vaccination Clinic  Name:  Samantha Bean    MRN: [id number] DOB: 1936/07/04  10/22/2019  Samantha Bean was observed post Covid-19 immunization for 15 minutes without incident. She was provided with Vaccine Information Sheet and instruction to access the V-Safe system.   Samantha Bean was instructed to call 911 with any severe reactions post vaccine: Marland Kitchen Difficulty breathing  . Swelling of face and throat  . A fast heartbeat  . A bad rash all over body  . Dizziness and weakness   Immunizations Administered    Name Date Dose VIS Date Route   Pfizer COVID-19 Vaccine 10/22/2019 12:16 PM 0.3 mL 08/02/2019 Intramuscular   Manufacturer: [place]   Lot: [id number]   [place]: [id number]

## 2019-11-19 DIAGNOSIS — E785 Hyperlipidemia, unspecified: Secondary | ICD-10-CM | POA: Diagnosis not present

## 2019-11-19 DIAGNOSIS — I10 Essential (primary) hypertension: Secondary | ICD-10-CM | POA: Diagnosis not present

## 2019-12-20 DIAGNOSIS — E785 Hyperlipidemia, unspecified: Secondary | ICD-10-CM | POA: Diagnosis not present

## 2019-12-20 DIAGNOSIS — I10 Essential (primary) hypertension: Secondary | ICD-10-CM | POA: Diagnosis not present

## 2020-01-10 DIAGNOSIS — I10 Essential (primary) hypertension: Secondary | ICD-10-CM | POA: Diagnosis not present

## 2020-01-10 DIAGNOSIS — E785 Hyperlipidemia, unspecified: Secondary | ICD-10-CM | POA: Diagnosis not present

## 2020-02-05 DIAGNOSIS — E785 Hyperlipidemia, unspecified: Secondary | ICD-10-CM | POA: Diagnosis not present

## 2020-02-05 DIAGNOSIS — I10 Essential (primary) hypertension: Secondary | ICD-10-CM | POA: Diagnosis not present

## 2020-02-27 DIAGNOSIS — E785 Hyperlipidemia, unspecified: Secondary | ICD-10-CM | POA: Diagnosis not present

## 2020-02-27 DIAGNOSIS — I10 Essential (primary) hypertension: Secondary | ICD-10-CM | POA: Diagnosis not present

## 2020-04-01 DIAGNOSIS — G2581 Restless legs syndrome: Secondary | ICD-10-CM | POA: Diagnosis not present

## 2020-04-01 DIAGNOSIS — I10 Essential (primary) hypertension: Secondary | ICD-10-CM | POA: Diagnosis not present

## 2020-04-17 DIAGNOSIS — R9389 Abnormal findings on diagnostic imaging of other specified body structures: Secondary | ICD-10-CM | POA: Diagnosis not present

## 2020-04-21 DIAGNOSIS — E785 Hyperlipidemia, unspecified: Secondary | ICD-10-CM | POA: Diagnosis not present

## 2020-04-21 DIAGNOSIS — I10 Essential (primary) hypertension: Secondary | ICD-10-CM | POA: Diagnosis not present

## 2020-05-21 DIAGNOSIS — E785 Hyperlipidemia, unspecified: Secondary | ICD-10-CM | POA: Diagnosis not present

## 2020-05-21 DIAGNOSIS — I10 Essential (primary) hypertension: Secondary | ICD-10-CM | POA: Diagnosis not present

## 2020-06-17 DIAGNOSIS — E785 Hyperlipidemia, unspecified: Secondary | ICD-10-CM | POA: Diagnosis not present

## 2020-06-17 DIAGNOSIS — I10 Essential (primary) hypertension: Secondary | ICD-10-CM | POA: Diagnosis not present

## 2020-06-30 DIAGNOSIS — Z23 Encounter for immunization: Secondary | ICD-10-CM | POA: Diagnosis not present

## 2020-07-18 DIAGNOSIS — I10 Essential (primary) hypertension: Secondary | ICD-10-CM | POA: Diagnosis not present

## 2020-07-18 DIAGNOSIS — E785 Hyperlipidemia, unspecified: Secondary | ICD-10-CM | POA: Diagnosis not present

## 2020-08-05 DIAGNOSIS — E785 Hyperlipidemia, unspecified: Secondary | ICD-10-CM | POA: Diagnosis not present

## 2020-08-05 DIAGNOSIS — I10 Essential (primary) hypertension: Secondary | ICD-10-CM | POA: Diagnosis not present

## 2020-08-13 DIAGNOSIS — Z03818 Encounter for observation for suspected exposure to other biological agents ruled out: Secondary | ICD-10-CM | POA: Diagnosis not present

## 2020-08-13 DIAGNOSIS — Z20822 Contact with and (suspected) exposure to covid-19: Secondary | ICD-10-CM | POA: Diagnosis not present

## 2020-08-31 ENCOUNTER — Other Ambulatory Visit: Payer: Self-pay | Admitting: Geriatric Medicine

## 2020-08-31 DIAGNOSIS — Z1231 Encounter for screening mammogram for malignant neoplasm of breast: Secondary | ICD-10-CM

## 2020-09-17 DIAGNOSIS — I10 Essential (primary) hypertension: Secondary | ICD-10-CM | POA: Diagnosis not present

## 2020-09-17 DIAGNOSIS — E785 Hyperlipidemia, unspecified: Secondary | ICD-10-CM | POA: Diagnosis not present

## 2020-10-06 DIAGNOSIS — E785 Hyperlipidemia, unspecified: Secondary | ICD-10-CM | POA: Diagnosis not present

## 2020-10-06 DIAGNOSIS — Z0000 Encounter for general adult medical examination without abnormal findings: Secondary | ICD-10-CM | POA: Diagnosis not present

## 2020-10-06 DIAGNOSIS — I700 Atherosclerosis of aorta: Secondary | ICD-10-CM | POA: Diagnosis not present

## 2020-10-06 DIAGNOSIS — I10 Essential (primary) hypertension: Secondary | ICD-10-CM | POA: Diagnosis not present

## 2020-10-06 DIAGNOSIS — Z1389 Encounter for screening for other disorder: Secondary | ICD-10-CM | POA: Diagnosis not present

## 2020-10-06 DIAGNOSIS — G609 Hereditary and idiopathic neuropathy, unspecified: Secondary | ICD-10-CM | POA: Diagnosis not present

## 2020-10-06 DIAGNOSIS — Z79899 Other long term (current) drug therapy: Secondary | ICD-10-CM | POA: Diagnosis not present

## 2020-10-06 DIAGNOSIS — G2581 Restless legs syndrome: Secondary | ICD-10-CM | POA: Diagnosis not present

## 2020-10-09 ENCOUNTER — Ambulatory Visit
Admission: RE | Admit: 2020-10-09 | Discharge: 2020-10-09 | Disposition: A | Discharge status: Home / Self Care | Payer: PPO | Source: Ambulatory Visit | Attending: Geriatric Medicine | Admitting: Geriatric Medicine

## 2020-10-09 ENCOUNTER — Other Ambulatory Visit: Payer: Self-pay

## 2020-10-09 DIAGNOSIS — Z1231 Encounter for screening mammogram for malignant neoplasm of breast: Secondary | ICD-10-CM | POA: Diagnosis not present

## 2020-10-14 DIAGNOSIS — E785 Hyperlipidemia, unspecified: Secondary | ICD-10-CM | POA: Diagnosis not present

## 2020-10-14 DIAGNOSIS — I10 Essential (primary) hypertension: Secondary | ICD-10-CM | POA: Diagnosis not present

## 2020-11-11 DIAGNOSIS — G2581 Restless legs syndrome: Secondary | ICD-10-CM | POA: Diagnosis not present

## 2020-11-13 DIAGNOSIS — E785 Hyperlipidemia, unspecified: Secondary | ICD-10-CM | POA: Diagnosis not present

## 2020-11-13 DIAGNOSIS — I10 Essential (primary) hypertension: Secondary | ICD-10-CM | POA: Diagnosis not present

## 2020-12-15 DIAGNOSIS — I10 Essential (primary) hypertension: Secondary | ICD-10-CM | POA: Diagnosis not present

## 2020-12-15 DIAGNOSIS — E785 Hyperlipidemia, unspecified: Secondary | ICD-10-CM | POA: Diagnosis not present

## 2021-01-11 DIAGNOSIS — E785 Hyperlipidemia, unspecified: Secondary | ICD-10-CM | POA: Diagnosis not present

## 2021-01-11 DIAGNOSIS — I10 Essential (primary) hypertension: Secondary | ICD-10-CM | POA: Diagnosis not present

## 2021-01-16 DIAGNOSIS — I959 Hypotension, unspecified: Secondary | ICD-10-CM | POA: Diagnosis not present

## 2021-01-16 DIAGNOSIS — R0902 Hypoxemia: Secondary | ICD-10-CM | POA: Diagnosis not present

## 2021-01-16 DIAGNOSIS — R159 Full incontinence of feces: Secondary | ICD-10-CM | POA: Diagnosis not present

## 2021-01-16 DIAGNOSIS — R55 Syncope and collapse: Secondary | ICD-10-CM | POA: Diagnosis not present

## 2021-01-16 DIAGNOSIS — R4020 Unspecified coma: Secondary | ICD-10-CM | POA: Diagnosis not present

## 2021-01-20 DIAGNOSIS — I10 Essential (primary) hypertension: Secondary | ICD-10-CM | POA: Diagnosis not present

## 2021-01-20 DIAGNOSIS — R55 Syncope and collapse: Secondary | ICD-10-CM | POA: Diagnosis not present

## 2021-01-20 DIAGNOSIS — G2581 Restless legs syndrome: Secondary | ICD-10-CM | POA: Diagnosis not present

## 2021-02-06 DIAGNOSIS — R55 Syncope and collapse: Secondary | ICD-10-CM | POA: Diagnosis not present

## 2021-02-12 DIAGNOSIS — E785 Hyperlipidemia, unspecified: Secondary | ICD-10-CM | POA: Diagnosis not present

## 2021-02-12 DIAGNOSIS — I10 Essential (primary) hypertension: Secondary | ICD-10-CM | POA: Diagnosis not present

## 2021-03-10 DIAGNOSIS — R55 Syncope and collapse: Secondary | ICD-10-CM | POA: Diagnosis not present

## 2021-03-11 DIAGNOSIS — E785 Hyperlipidemia, unspecified: Secondary | ICD-10-CM | POA: Diagnosis not present

## 2021-03-11 DIAGNOSIS — I10 Essential (primary) hypertension: Secondary | ICD-10-CM | POA: Diagnosis not present

## 2021-04-01 ENCOUNTER — Ambulatory Visit: Payer: PPO | Admitting: Podiatry

## 2021-04-09 DIAGNOSIS — I10 Essential (primary) hypertension: Secondary | ICD-10-CM | POA: Diagnosis not present

## 2021-04-09 DIAGNOSIS — E785 Hyperlipidemia, unspecified: Secondary | ICD-10-CM | POA: Diagnosis not present

## 2021-04-12 ENCOUNTER — Ambulatory Visit: Payer: PPO | Admitting: Podiatry

## 2021-04-12 ENCOUNTER — Encounter: Payer: Self-pay | Admitting: Podiatry

## 2021-04-12 ENCOUNTER — Other Ambulatory Visit: Payer: Self-pay

## 2021-04-12 DIAGNOSIS — M79675 Pain in left toe(s): Secondary | ICD-10-CM

## 2021-04-12 DIAGNOSIS — G629 Polyneuropathy, unspecified: Secondary | ICD-10-CM | POA: Diagnosis not present

## 2021-04-12 DIAGNOSIS — M79674 Pain in right toe(s): Secondary | ICD-10-CM

## 2021-04-12 DIAGNOSIS — B351 Tinea unguium: Secondary | ICD-10-CM | POA: Diagnosis not present

## 2021-04-12 NOTE — Patient Instructions (Signed)
I have ordered a medication for you that will come from [facility] in [place]. They should be calling you to verify insurance and will mail the medication to you. If you live close by then you can go by their pharmacy to pick up the medication. Their phone number is [phone]. If you do not hear from them in the next few days, please give us a call at [phone].   

## 2021-04-19 NOTE — Progress Notes (Signed)
Subjective:   Patient ID: Samantha Bean, female   DOB: 85 y.o.   MRN: [id number]   HPI 85 year old female presents the office today for concerns of thick, discolored toenails that she cannot trim her self.  She does have neuropathy as well.  She is not sure what caused the neuropathy.  She denies any open lesions.  She has no other concerns today.   Review of Systems  All other systems reviewed and are negative.  Past Medical History:  Diagnosis Date   Benign essential HTN    Breast cancer ([place]) 2000   Left   Hyperlipidemia    Personal history of radiation therapy     Past Surgical History:  Procedure Laterality Date   BREAST LUMPECTOMY Left 2000     Current Outpatient Medications:    NON FORMULARY, [facility]  Antifungal (nail)-#1, Disp: , Rfl:    amLODipine (NORVASC) 2.5 MG tablet, Take 2.5 mg by mouth daily., Disp: , Rfl:    BIOTIN PO, Take 1 tablet by mouth daily., Disp: , Rfl:    CALCIUM PO, Take 1 tablet by mouth daily., Disp: , Rfl:    hydrochlorothiazide (HYDRODIURIL) 12.5 MG tablet, Take 2.5 mg by mouth daily. , Disp: , Rfl:    metoprolol tartrate (LOPRESSOR) 50 MG tablet, Take 1 tablet (50 mg total) by mouth 2 (two) times daily. Take 1 hour prior to scheduled coronary CT., Disp: 1 tablet, Rfl: 0   Multiple Vitamin (MULTIVITAMIN) tablet, Take 1 tablet by mouth daily., Disp: , Rfl:    PFIZER-BIONT COVID-19 VAC-TRIS SUSP injection, , Disp: , Rfl:    rOPINIRole (REQUIP) 2 MG tablet, Take 2 mg by mouth at bedtime., Disp: , Rfl:    simvastatin (ZOCOR) 40 MG tablet, SMARTSIG:1 Tablet(s) By Mouth Every Evening, Disp: , Rfl:   No Known Allergies       Objective:  Physical Exam  General: AAO x3, NAD  Dermatological: Nails are hypertrophic, dystrophic, brittle, yellow to brown discoloration, elongated 10. No surrounding redness or drainage. Tenderness nails 1-5 bilaterally. No open lesions or pre-ulcerative lesions are identified today.  Vascular: Dorsalis  Pedis artery and Posterior Tibial artery pedal pulses are 2/4 bilateral with immedate capillary fill time. There is no pain with calf compression, swelling, warmth, erythema.   Neruologic: Grossly decreased with Semmes Weinstein monofilament.  Musculoskeletal:-Nails no pharyngeal discomfort.  Muscular strength 5/5 in all groups tested bilateral.      Assessment:   Symptomatic onychomycosis     Plan:  -Treatment options discussed including all alternatives, risks, and complications -Etiology of symptoms were discussed -Nails debrided 10 without complications or bleeding.  Discussed treatment options for nail fungus.  Elected to defer any oral medications for her but she has been try to treat this.  Prescribed a compound cream today through [employer]. -Daily foot inspection -Follow-up in 3 months or sooner if any problems arise. In the meantime, encouraged to call the office with any questions, concerns, change in symptoms.   Celesta Gentile, DPM

## 2021-06-03 DIAGNOSIS — I10 Essential (primary) hypertension: Secondary | ICD-10-CM | POA: Diagnosis not present

## 2021-06-03 DIAGNOSIS — E785 Hyperlipidemia, unspecified: Secondary | ICD-10-CM | POA: Diagnosis not present

## 2021-06-30 DIAGNOSIS — I10 Essential (primary) hypertension: Secondary | ICD-10-CM | POA: Diagnosis not present

## 2021-06-30 DIAGNOSIS — E785 Hyperlipidemia, unspecified: Secondary | ICD-10-CM | POA: Diagnosis not present

## 2021-07-07 ENCOUNTER — Other Ambulatory Visit: Payer: Self-pay | Admitting: Physician Assistant

## 2021-07-07 ENCOUNTER — Other Ambulatory Visit: Payer: Self-pay

## 2021-07-07 ENCOUNTER — Ambulatory Visit
Admission: RE | Admit: 2021-07-07 | Discharge: 2021-07-07 | Disposition: A | Discharge status: Home / Self Care | Payer: PPO | Source: Ambulatory Visit | Attending: Physician Assistant | Admitting: Physician Assistant

## 2021-07-07 DIAGNOSIS — R159 Full incontinence of feces: Secondary | ICD-10-CM | POA: Diagnosis not present

## 2021-07-07 DIAGNOSIS — R152 Fecal urgency: Secondary | ICD-10-CM | POA: Diagnosis not present

## 2021-07-13 ENCOUNTER — Other Ambulatory Visit: Payer: Self-pay

## 2021-07-13 ENCOUNTER — Ambulatory Visit: Payer: PPO | Admitting: Podiatry

## 2021-07-13 ENCOUNTER — Encounter: Payer: Self-pay | Admitting: Podiatry

## 2021-07-13 DIAGNOSIS — B351 Tinea unguium: Secondary | ICD-10-CM

## 2021-07-13 DIAGNOSIS — R159 Full incontinence of feces: Secondary | ICD-10-CM | POA: Insufficient documentation

## 2021-07-13 DIAGNOSIS — M79674 Pain in right toe(s): Secondary | ICD-10-CM

## 2021-07-13 DIAGNOSIS — R9389 Abnormal findings on diagnostic imaging of other specified body structures: Secondary | ICD-10-CM | POA: Insufficient documentation

## 2021-07-13 DIAGNOSIS — F09 Unspecified mental disorder due to known physiological condition: Secondary | ICD-10-CM | POA: Insufficient documentation

## 2021-07-13 DIAGNOSIS — I10 Essential (primary) hypertension: Secondary | ICD-10-CM | POA: Insufficient documentation

## 2021-07-13 DIAGNOSIS — G609 Hereditary and idiopathic neuropathy, unspecified: Secondary | ICD-10-CM | POA: Insufficient documentation

## 2021-07-13 DIAGNOSIS — G2581 Restless legs syndrome: Secondary | ICD-10-CM | POA: Insufficient documentation

## 2021-07-13 DIAGNOSIS — E785 Hyperlipidemia, unspecified: Secondary | ICD-10-CM | POA: Insufficient documentation

## 2021-07-13 DIAGNOSIS — G629 Polyneuropathy, unspecified: Secondary | ICD-10-CM

## 2021-07-13 DIAGNOSIS — I700 Atherosclerosis of aorta: Secondary | ICD-10-CM | POA: Insufficient documentation

## 2021-07-13 DIAGNOSIS — M79675 Pain in left toe(s): Secondary | ICD-10-CM

## 2021-07-17 NOTE — Progress Notes (Signed)
  Subjective:  Patient ID: Samantha Bean, female    DOB: July 15, 1936,  MRN: [id number]  Samantha Bean presents to clinic today for painful elongated mycotic toenails 1-5 bilaterally which are tender when wearing enclosed shoe gear. Pain is relieved with periodic professional debridement.  She voices no new pedal problems on today's visit.  PCP is Lajean Manes, MD , and last visit was 07/07/2021.  No Known Allergies  Review of Systems: Negative except as noted in the HPI. Objective:   Constitutional ZOEJANE GAULIN is a pleasant 85 y.o. Caucasian female, in NAD. AAO x 3.   Vascular CFT immediate b/l LE. Palpable DP/PT pulses b/l LE. Digital hair sparse b/l. Skin temperature gradient WNL b/l. No pain with calf compression b/l. No edema noted b/l. No cyanosis or clubbing noted b/l LE.  Neurologic Normal speech. Oriented to person, place, and time. Protective sensation decreased with 10 gram monofilament b/l.  Dermatologic Pedal integument with normal turgor, texture and tone b/l LE. No open wounds b/l. No interdigital macerations b/l. Toenails 1-5 b/l elongated, thickened, discolored with subungual debris. +Tenderness with dorsal palpation of nailplates. No hyperkeratotic or porokeratotic lesions present.  Orthopedic: Normal muscle strength 5/5 to all lower extremity muscle groups bilaterally. No pain, crepitus or joint limitation noted with ROM b/l LE. No gross bony pedal deformities b/l. Patient ambulates independently without assistive aids.   Radiographs: None   Assessment:   1. Pain due to onychomycosis of toenails of both feet   2. Neuropathy    Plan:  Patient was evaluated and treated and all questions answered. Consent given for treatment as described below: -No new findings. No new orders. -Mycotic toenails were debrided in length and girth 1-5 bilaterally with sterile nail nippers and dremel without iatrogenic bleeding. Continue daily use of topical compounded antifungal  medication from [place] as instructed. -Patient/POA to call should there be question/concern in the interim.  Return in about 3 months (around 10/13/2021).  Marzetta Board, DPM

## 2021-07-26 DIAGNOSIS — I10 Essential (primary) hypertension: Secondary | ICD-10-CM | POA: Diagnosis not present

## 2021-07-26 DIAGNOSIS — E785 Hyperlipidemia, unspecified: Secondary | ICD-10-CM | POA: Diagnosis not present

## 2021-08-26 DIAGNOSIS — I10 Essential (primary) hypertension: Secondary | ICD-10-CM | POA: Diagnosis not present

## 2021-08-26 DIAGNOSIS — E785 Hyperlipidemia, unspecified: Secondary | ICD-10-CM | POA: Diagnosis not present

## 2021-09-02 ENCOUNTER — Other Ambulatory Visit: Payer: Self-pay | Admitting: Geriatric Medicine

## 2021-09-02 DIAGNOSIS — Z1231 Encounter for screening mammogram for malignant neoplasm of breast: Secondary | ICD-10-CM

## 2021-10-11 ENCOUNTER — Ambulatory Visit
Admission: RE | Admit: 2021-10-11 | Discharge: 2021-10-11 | Disposition: A | Discharge status: Home / Self Care | Payer: PPO | Source: Ambulatory Visit | Attending: Geriatric Medicine | Admitting: Geriatric Medicine

## 2021-10-11 DIAGNOSIS — Z1231 Encounter for screening mammogram for malignant neoplasm of breast: Secondary | ICD-10-CM

## 2021-10-12 DIAGNOSIS — Z1389 Encounter for screening for other disorder: Secondary | ICD-10-CM | POA: Diagnosis not present

## 2021-10-12 DIAGNOSIS — I700 Atherosclerosis of aorta: Secondary | ICD-10-CM | POA: Diagnosis not present

## 2021-10-12 DIAGNOSIS — Z79899 Other long term (current) drug therapy: Secondary | ICD-10-CM | POA: Diagnosis not present

## 2021-10-12 DIAGNOSIS — Z0000 Encounter for general adult medical examination without abnormal findings: Secondary | ICD-10-CM | POA: Diagnosis not present

## 2021-10-12 DIAGNOSIS — I10 Essential (primary) hypertension: Secondary | ICD-10-CM | POA: Diagnosis not present

## 2021-10-12 DIAGNOSIS — E785 Hyperlipidemia, unspecified: Secondary | ICD-10-CM | POA: Diagnosis not present

## 2021-10-12 DIAGNOSIS — G609 Hereditary and idiopathic neuropathy, unspecified: Secondary | ICD-10-CM | POA: Diagnosis not present

## 2021-10-12 DIAGNOSIS — G2581 Restless legs syndrome: Secondary | ICD-10-CM | POA: Diagnosis not present

## 2021-10-22 ENCOUNTER — Encounter: Payer: Self-pay | Admitting: Podiatry

## 2021-10-22 ENCOUNTER — Other Ambulatory Visit: Payer: Self-pay

## 2021-10-22 ENCOUNTER — Ambulatory Visit: Payer: PPO | Admitting: Podiatry

## 2021-10-22 DIAGNOSIS — M79675 Pain in left toe(s): Secondary | ICD-10-CM | POA: Diagnosis not present

## 2021-10-22 DIAGNOSIS — G629 Polyneuropathy, unspecified: Secondary | ICD-10-CM | POA: Diagnosis not present

## 2021-10-22 DIAGNOSIS — B351 Tinea unguium: Secondary | ICD-10-CM

## 2021-10-22 DIAGNOSIS — M79674 Pain in right toe(s): Secondary | ICD-10-CM

## 2021-10-31 NOTE — Progress Notes (Signed)
?  Subjective:  ?Patient ID: Samantha Bean, female    DOB: 08-Oct-1935,  MRN: [id number] ? ?Samantha Bean presents to clinic today for painful elongated mycotic toenails 1-5 bilaterally which are tender when wearing enclosed shoe gear. Pain is relieved with periodic professional debridement. ? ?New problem(s): None.  ? ?PCP is Lajean Manes, MD , and last visit was October 12, 2021. ? ?No Known Allergies ? ?Review of Systems: Negative except as noted in the HPI. ? ?Objective: ? ?Constitutional Samantha Bean is a pleasant 86 y.o. Caucasian female, in NAD. AAO x 3.   ?Vascular CFT immediate b/l LE. Palpable DP/PT pulses b/l LE. Digital hair sparse b/l. Skin temperature gradient WNL b/l. No pain with calf compression b/l. No edema noted b/l. No cyanosis or clubbing noted b/l LE.  ?Neurologic Normal speech. Oriented to person, place, and time. Protective sensation decreased with 10 gram monofilament b/l.  ?Dermatologic Pedal integument with normal turgor, texture and tone b/l LE. No open wounds b/l. No interdigital macerations b/l. Toenails 1-5 b/l elongated, thickened, discolored with subungual debris. +Tenderness with dorsal palpation of nailplates. No hyperkeratotic or porokeratotic lesions present.  ?Orthopedic: Normal muscle strength 5/5 to all lower extremity muscle groups bilaterally. No pain, crepitus or joint limitation noted with ROM b/l LE. No gross bony pedal deformities b/l. Patient ambulates independently without assistive aids.  ?Assessment/Plan: ?1. Pain due to onychomycosis of toenails of both feet   ?2. Neuropathy   ?-Examined patient. ?-Mycotic toenails were debrided in length and girth 1-5 bilaterally with sterile nail nippers and dremel without iatrogenic bleeding. Continue daily use of topical compounded antifungal medication from [place] as instructed. ?-Patient/POA to call should there be question/concern in the interim.  ? ?Return in about 3 months (around  01/22/2022). ? ?Marzetta Board, DPM  ?

## 2022-01-24 ENCOUNTER — Ambulatory Visit: Payer: PPO | Admitting: Podiatry

## 2022-01-24 DIAGNOSIS — B351 Tinea unguium: Secondary | ICD-10-CM | POA: Diagnosis not present

## 2022-01-24 DIAGNOSIS — M79675 Pain in left toe(s): Secondary | ICD-10-CM | POA: Diagnosis not present

## 2022-01-24 DIAGNOSIS — M79674 Pain in right toe(s): Secondary | ICD-10-CM | POA: Diagnosis not present

## 2022-01-24 DIAGNOSIS — G629 Polyneuropathy, unspecified: Secondary | ICD-10-CM

## 2022-01-24 DIAGNOSIS — L84 Corns and callosities: Secondary | ICD-10-CM | POA: Diagnosis not present

## 2022-01-30 ENCOUNTER — Encounter: Payer: Self-pay | Admitting: Podiatry

## 2022-01-30 NOTE — Progress Notes (Signed)
  Subjective:  Patient ID: Samantha Bean, female    DOB: October 28, 1935,  MRN: [id number]  Samantha Bean presents to clinic today for at risk foot care with history of peripheral neuropathy and callus(es) left foot and painful thick toenails that are difficult to trim. Painful toenails interfere with ambulation. Aggravating factors include wearing enclosed shoe gear. Pain is relieved with periodic professional debridement. Painful calluses are aggravated when weightbearing with and without shoegear. Pain is relieved with periodic professional debridement.  Patient states she purchased and has been applying chemical corn/callus remover due to discomfort from her callus.  PCP is Lajean Manes, MD , and last visit was January 21, 2022.  No Known Allergies  Review of Systems: Negative except as noted in the HPI.  Objective:  Constitutional Samantha Bean is a pleasant 86 y.o. Caucasian female, in NAD. AAO x 3.   Vascular CFT immediate b/l LE. Palpable DP/PT pulses b/l LE. Digital hair sparse b/l. Skin temperature gradient WNL b/l. No pain with calf compression b/l. No edema noted b/l. No cyanosis or clubbing noted b/l LE.  Neurologic Normal speech. Oriented to person, place, and time. Protective sensation decreased with 10 gram monofilament b/l.  Dermatologic Pedal integument with thin and atrophic skin. No open wounds b/l. No interdigital macerations b/l. Toenails 1-5 b/l elongated, thickened, discolored with subungual debris. +Tenderness with dorsal palpation of nailplates. Hyperkeratotic lesion(s) submet head 1 left foot.  No erythema, no edema, no drainage, no fluctuance.  Orthopedic: Normal muscle strength 5/5 to all lower extremity muscle groups bilaterally. No pain, crepitus or joint limitation noted with ROM b/l LE. No gross bony pedal deformities b/l. Patient ambulates independently without assistive aids.   Assessment/Plan: 1. Pain due to onychomycosis of toenails of both feet   2. Callus    3. Neuropathy     -Examined patient. -Patient counseled on dangers of using OTC corn/callus remover which could lead to wound formation. Patient related understanding at today's visit. -Mycotic toenails were debrided in length and girth 1-5 bilaterally with sterile nail nippers and dremel without iatrogenic bleeding. Continue daily use of topical compounded antifungal medication from [place] as instructed. -Callus(es) submet head 1 left foot pared utilizing sterile scalpel blade without complication or incident. Total number debrided =1. -Patient/POA to call should there be question/concern in the interim.   Return in about 3 months (around 04/26/2022).  Marzetta Board, DPM

## 2022-04-12 DIAGNOSIS — G3184 Mild cognitive impairment, so stated: Secondary | ICD-10-CM | POA: Diagnosis not present

## 2022-04-12 DIAGNOSIS — I10 Essential (primary) hypertension: Secondary | ICD-10-CM | POA: Diagnosis not present

## 2022-04-12 DIAGNOSIS — G2581 Restless legs syndrome: Secondary | ICD-10-CM | POA: Diagnosis not present

## 2022-04-12 DIAGNOSIS — G609 Hereditary and idiopathic neuropathy, unspecified: Secondary | ICD-10-CM | POA: Diagnosis not present

## 2022-05-03 ENCOUNTER — Ambulatory Visit: Payer: PPO | Admitting: Podiatry

## 2022-05-10 ENCOUNTER — Telehealth: Payer: Self-pay

## 2022-05-13 NOTE — Telephone Encounter (Signed)
No further notes are needed

## 2022-06-14 ENCOUNTER — Ambulatory Visit: Payer: PPO | Admitting: Podiatry

## 2022-06-20 ENCOUNTER — Ambulatory Visit: Payer: PPO | Admitting: Podiatry

## 2022-06-20 ENCOUNTER — Encounter: Payer: Self-pay | Admitting: Podiatry

## 2022-06-20 DIAGNOSIS — G629 Polyneuropathy, unspecified: Secondary | ICD-10-CM

## 2022-06-20 DIAGNOSIS — B351 Tinea unguium: Secondary | ICD-10-CM | POA: Diagnosis not present

## 2022-06-20 DIAGNOSIS — L84 Corns and callosities: Secondary | ICD-10-CM

## 2022-06-20 DIAGNOSIS — M79675 Pain in left toe(s): Secondary | ICD-10-CM

## 2022-06-20 DIAGNOSIS — M79674 Pain in right toe(s): Secondary | ICD-10-CM

## 2022-06-20 NOTE — Progress Notes (Unsigned)
  Subjective:  Patient ID: Jeanett Schlein, female    DOB: [birth year],  MRN: [id number]  LAIKYN GEWIRTZ presents to clinic today for No chief complaint on file. . New problem(s): None. {jgcomplaint:23593}  PCP is Stoneking, Hal, MD , and last visit was  {Time; dates multiple:15870}.  No Known Allergies  Review of Systems: Negative except as noted in the HPI.  Objective: No changes noted in today's physical examination.  MURRY KHIEV is a pleasant 86 y.o. female WD, WN in NAD. AAO x 3. Vascular CFT immediate b/l LE. Palpable DP/PT pulses b/l LE. Digital hair sparse b/l. Skin temperature gradient WNL b/l. No pain with calf compression b/l. No edema noted b/l. No cyanosis or clubbing noted b/l LE.  Neurologic Normal speech. Oriented to person, place, and time. Protective sensation decreased with 10 gram monofilament b/l.  Dermatologic Pedal integument with thin and atrophic skin. No open wounds b/l. No interdigital macerations b/l. Toenails 1-5 b/l elongated, thickened, discolored with subungual debris. +Tenderness with dorsal palpation of nailplates.   Hyperkeratotic lesion(s) submet head 1 left foot.  No erythema, no edema, no drainage, no fluctuance.  Orthopedic: Normal muscle strength 5/5 to all lower extremity muscle groups bilaterally. No pain, crepitus or joint limitation noted with ROM b/l LE. No gross bony pedal deformities b/l. Patient ambulates independently without assistive aids.   Assessment/Plan: 1. Pain due to onychomycosis of toenails of both feet   2. Callus   3. Neuropathy     No orders of the defined types were placed in this encounter.   {Jgplan:23602::"-Patient/POA to call should there be question/concern in the interim."}   Return in about 3 months (around 09/20/2022).  Marzetta Board, DPM

## 2022-07-07 ENCOUNTER — Telehealth: Payer: Self-pay | Admitting: Podiatry

## 2022-07-07 NOTE — Telephone Encounter (Signed)
Pt called wanting to speak to Dr Elisha Ponder and I did explain she is in clinic today with pts. She was wanting to know who she would need to see  her left foot when putting weight it gives out on her. I did explain that she would not see Dr Elisha Ponder but could possibly see another provider in our office if she would recommend that or orthopedic

## 2022-07-28 ENCOUNTER — Ambulatory Visit (INDEPENDENT_AMBULATORY_CARE_PROVIDER_SITE_OTHER): Payer: PPO

## 2022-07-28 ENCOUNTER — Ambulatory Visit: Payer: PPO | Admitting: Podiatry

## 2022-07-28 DIAGNOSIS — M25372 Other instability, left ankle: Secondary | ICD-10-CM | POA: Diagnosis not present

## 2022-07-28 DIAGNOSIS — M25572 Pain in left ankle and joints of left foot: Secondary | ICD-10-CM | POA: Diagnosis not present

## 2022-07-28 MED ORDER — METHYLPREDNISOLONE 4 MG PO TBPK: ORAL_TABLET | ORAL | 0 refills | Status: AC

## 2022-07-28 NOTE — Progress Notes (Signed)
  Subjective:  Patient ID: Samantha Bean, female    DOB: 1935-10-06,  MRN: [id number]  Chief Complaint  Patient presents with   Foot Problem    Pain to top of foot     86 y.o. female presents with complaint of Left ankle pain.  She states she has instability and tenderness about the left ankle.  This has been going on for many months.  She feels like the ankle gives out on her frequently.  She also reports pain in the top of the foot as well.  Not sure what she did that started this denies any acute injury.  Past Medical History:  Diagnosis Date   Benign essential HTN    Breast cancer ([place]) 2000   Left   Hyperlipidemia    Personal history of radiation therapy     No Known Allergies  ROS: Negative except as per HPI above  Objective:  General: AAO x3, NAD  Dermatological: With inspection and palpation of the right and left lower extremities there are no open sores, no preulcerative lesions, no rash or signs of infection present. Nails are of normal length thickness and coloration.   Vascular:  Dorsalis Pedis artery and Posterior Tibial artery pedal pulses are 2/4 bilateral.  Capillary fill time < 3 sec to all digits.   Neruologic: Grossly intact via light touch bilateral. Protective threshold intact to all sites bilateral.   Musculoskeletal: Mild pain with palpation about the left ankle and dorsal midfoot.  There is instability noted with inversion eversion range of motion of the left ankle.  Gait: Unassisted, Nonantalgic.   No images are attached to the encounter.  Radiographs:  Date: 07/28/22 XR left ankle weightbearing AP/Lateral/Oblique   Findings: no fracture, dislocation, swelling or degenerative changes noted Assessment:   1. Ankle joint instability, left   2. Acute left ankle pain      Plan:  Patient was evaluated and treated and all questions answered.  # Ankle joint instability and left ankle pain -Recommend use of a ankle brace for instability issue  when she is ambulating. -Ankle brace Tri-Lock was dispensed to the patient at this visit. -Recommend course of anti-inflammatory treatment with methylprednisolone 4 mg steroid taper pack to take as directed for 6 days. -Patient will follow-up in 6 weeks to recheck left ankle instability and pain  Return in about 6 weeks (around 09/08/2022) for follow up L ankle instability, pain.          Everitt Amber, DPM [facility]

## 2022-09-01 ENCOUNTER — Other Ambulatory Visit: Payer: Self-pay | Admitting: Geriatric Medicine

## 2022-09-01 DIAGNOSIS — Z1231 Encounter for screening mammogram for malignant neoplasm of breast: Secondary | ICD-10-CM

## 2022-09-08 ENCOUNTER — Ambulatory Visit: Payer: PPO | Admitting: Podiatry

## 2022-10-12 ENCOUNTER — Ambulatory Visit: Payer: PPO | Admitting: Podiatry

## 2022-10-24 ENCOUNTER — Ambulatory Visit
Admission: RE | Admit: 2022-10-24 | Discharge: 2022-10-24 | Disposition: A | Discharge status: Home / Self Care | Payer: PPO | Source: Ambulatory Visit | Attending: Geriatric Medicine | Admitting: Geriatric Medicine

## 2022-10-24 DIAGNOSIS — Z1231 Encounter for screening mammogram for malignant neoplasm of breast: Secondary | ICD-10-CM

## 2022-12-19 ENCOUNTER — Ambulatory Visit: Payer: PPO | Admitting: Podiatry

## 2022-12-19 ENCOUNTER — Encounter: Payer: Self-pay | Admitting: Podiatry

## 2022-12-19 VITALS — BP 158/63

## 2022-12-19 DIAGNOSIS — L84 Corns and callosities: Secondary | ICD-10-CM

## 2022-12-19 DIAGNOSIS — M79674 Pain in right toe(s): Secondary | ICD-10-CM

## 2022-12-19 DIAGNOSIS — M79675 Pain in left toe(s): Secondary | ICD-10-CM | POA: Diagnosis not present

## 2022-12-19 DIAGNOSIS — G629 Polyneuropathy, unspecified: Secondary | ICD-10-CM | POA: Diagnosis not present

## 2022-12-19 DIAGNOSIS — B351 Tinea unguium: Secondary | ICD-10-CM

## 2022-12-20 NOTE — Progress Notes (Signed)
  Subjective:  Patient ID: Samantha Bean, female    DOB: 1936-03-12,  MRN: [id number]  Samantha Bean presents to clinic today for:  Chief Complaint  Patient presents with   Nail Problem    RFC PCP-Raju PCP VST-Have not met yet   New problem(s): Patient states she has a painful left great toe. Stubbed toe 2 months ago. Needs xrays. Also complains of left ankle swelling with no pain.  PCP is Thana Ates, MD.  No Known Allergies  Review of Systems: Negative except as noted in the HPI.  Objective: No changes noted in today's physical examination. Vitals:   12/19/22 1459  BP: (!) 158/63   Samantha Bean is a pleasant 87 y.o. female WD, WN in NAD. AAO x 3.  Vascular CFT immediate b/l LE. Palpable DP/PT pulses b/l LE. Digital hair sparse b/l. Skin temperature gradient WNL b/l. No pain with calf compression b/l. No edema noted b/l. No cyanosis or clubbing noted b/l LE.  Neurologic Normal speech. Oriented to person, place, and time. Protective sensation decreased with 10 gram monofilament b/l.  Dermatologic Pedal integument with thin and atrophic skin. No open wounds b/l. No interdigital macerations b/l.   Toenails 1-5 b/l elongated, thickened, discolored with subungual debris. +Tenderness with dorsal palpation of nailplates.   Hyperkeratotic lesion(s) submet head 1 left foot.  No erythema, no edema, no drainage, no fluctuance.  Orthopedic: Normal muscle strength 5/5 to all lower extremity muscle groups bilaterally. No pain, crepitus or joint limitation noted with ROM b/l LE. No gross bony pedal deformities b/l. Patient ambulates independently without assistive aids.   Assessment/Plan: 1. Pain due to onychomycosis of toenails of both feet   2. Callus   3. Neuropathy     -Consent given for treatment as described below: -Examined patient. -Patient with h/o trauma to left great toe. Will schedule with Dr. Lilian Kapur for evaluation of left great toe. -Mycotic toenails 1-5  bilaterally were debrided in length and girth with sterile nail nippers and dremel without incident. -Callus(es) submet head 1 left foot pared utilizing sharp debridement with sterile blade without complication or incident. Total number debrided =1. -Patient/POA to call should there be question/concern in the interim.   Return in about 3 months (around 03/20/2023).  Freddie Breech, DPM

## 2022-12-22 ENCOUNTER — Ambulatory Visit: Payer: PPO | Admitting: Podiatry

## 2022-12-26 ENCOUNTER — Other Ambulatory Visit: Payer: Self-pay | Admitting: Internal Medicine

## 2022-12-26 DIAGNOSIS — M8589 Other specified disorders of bone density and structure, multiple sites: Secondary | ICD-10-CM

## 2023-04-03 ENCOUNTER — Ambulatory Visit: Payer: PPO | Admitting: Podiatrist

## 2023-04-05 ENCOUNTER — Ambulatory Visit: Payer: PPO | Admitting: Podiatry

## 2023-04-05 DIAGNOSIS — L84 Corns and callosities: Secondary | ICD-10-CM | POA: Diagnosis not present

## 2023-04-05 DIAGNOSIS — B351 Tinea unguium: Secondary | ICD-10-CM

## 2023-04-05 DIAGNOSIS — M79675 Pain in left toe(s): Secondary | ICD-10-CM

## 2023-04-05 DIAGNOSIS — G629 Polyneuropathy, unspecified: Secondary | ICD-10-CM

## 2023-04-05 DIAGNOSIS — M79674 Pain in right toe(s): Secondary | ICD-10-CM | POA: Diagnosis not present

## 2023-04-05 NOTE — Progress Notes (Unsigned)
  Subjective:  Patient ID: Samantha Bean, female    DOB: [birth year],  MRN: [id number]  Samantha Bean presents to clinic today for:  No chief complaint on file.  New problem(s): Patient states she has a painful left great toe. Stubbed toe 2 months ago. Needs xrays. Also complains of left ankle swelling with no pain.  PCP is Thana Ates, MD.  No Known Allergies  Review of Systems: Negative except as noted in the HPI.  Objective: No changes noted in today's physical examination. There were no vitals filed for this visit.  Samantha Bean is a pleasant 87 y.o. female WD, WN in NAD. AAO x 3.  Vascular CFT immediate b/l LE. Palpable DP/PT pulses b/l LE. Digital hair sparse b/l. Skin temperature gradient WNL b/l. No pain with calf compression b/l. No edema noted b/l. No cyanosis or clubbing noted b/l LE.  Neurologic Normal speech. Oriented to person, place, and time. Protective sensation decreased with 10 gram monofilament b/l.  Dermatologic Pedal integument with thin and atrophic skin. No open wounds b/l. No interdigital macerations b/l.   Toenails 1-5 b/l elongated, thickened, discolored with subungual debris. +Tenderness with dorsal palpation of nailplates.   Hyperkeratotic lesion(s) submet head 1 left foot.  No erythema, no edema, no drainage, no fluctuance.  Orthopedic: Normal muscle strength 5/5 to all lower extremity muscle groups bilaterally. No pain, crepitus or joint limitation noted with ROM b/l LE. No gross bony pedal deformities b/l. Patient ambulates independently without assistive aids.   Assessment/Plan: No diagnosis found. .    No follow-ups on file.  Samantha Bean, DPM

## 2023-04-10 ENCOUNTER — Encounter: Payer: Self-pay | Admitting: Podiatry

## 2023-04-10 NOTE — Progress Notes (Signed)
  Subjective:  Patient ID: Samantha Bean, female    DOB: 09/13/1935,  MRN: [id number]  Samantha Bean presents to clinic today for at risk foot care with history of peripheral neuropathy and callus(es) left lower extremity and painful thick toenails that are difficult to trim. Painful toenails interfere with ambulation. Aggravating factors include wearing enclosed shoe gear. Pain is relieved with periodic professional debridement. Painful calluses are aggravated when weightbearing with and without shoegear. Pain is relieved with periodic professional debridement.  New problem(s): None.    PCP is Thana Ates, MD.  No Known Allergies  Review of Systems: Negative except as noted in the HPI.  Objective: No changes noted in today's physical examination. There were no vitals filed for this visit. Samantha Bean is a pleasant 87 y.o. female WD, WN in NAD. AAO x 3.  Vascular Examination: Capillary refill time immediate b/l. Vascular status intact b/l with palpable pedal pulses. Pedal hair present b/l. No pain with calf compression b/l. Skin temperature gradient WNL b/l. No cyanosis or clubbing b/l. No ischemia or gangrene noted b/l.   Neurological Examination: Protective sensation decreased with 10 gram monofilament b/l.  Dermatological Examination: Pedal skin with normal turgor, texture and tone b/l.  No open wounds. No interdigital macerations.   Toenails 1-5 b/l thick, discolored, elongated with subungual debris and pain on dorsal palpation.   Hyperkeratotic lesion(s) submet head 1 left foot.  No erythema, no edema, no drainage, no fluctuance.  Musculoskeletal Examination: Muscle strength 5/5 to all lower extremity muscle groups bilaterally. No pain, crepitus or joint limitation noted with ROM bilateral LE. No gross bony deformities bilaterally.  Radiographs: None  Assessment/Plan: 1. Pain due to onychomycosis of toenails of both feet   2. Callus   3. Neuropathy      -Patient was evaluated and treated. All patient's and/or POA's questions/concerns answered on today's visit. -Patient to continue soft, supportive shoe gear daily. -Toenails 1-5 b/l were debrided in length and girth with sterile nail nippers and dremel without iatrogenic bleeding.  -Callus(es) submet head 1 left foot pared utilizing sterile scalpel blade without complication or incident. Total number debrided =1. -Patient/POA to call should there be question/concern in the interim.   Return in about 3 months (around 07/06/2023).  Freddie Breech, DPM

## 2023-05-25 ENCOUNTER — Other Ambulatory Visit (HOSPITAL_COMMUNITY): Payer: Self-pay

## 2023-06-12 ENCOUNTER — Other Ambulatory Visit (HOSPITAL_COMMUNITY): Payer: Self-pay

## 2023-06-14 ENCOUNTER — Other Ambulatory Visit (HOSPITAL_COMMUNITY): Payer: Self-pay

## 2023-06-15 ENCOUNTER — Other Ambulatory Visit (HOSPITAL_COMMUNITY): Payer: Self-pay

## 2023-06-15 MED ORDER — HYDROCHLOROTHIAZIDE 25 MG PO TABS
25.0000 mg | ORAL_TABLET | Freq: Every morning | ORAL | 3 refills | Status: DC
  Filled 2023-06-15: qty 90, 90d supply, fill #0
  Filled 2023-06-21: qty 30, 30d supply, fill #0
  Filled 2023-07-14: qty 30, 30d supply, fill #1
  Filled 2023-08-09 – 2023-08-10 (×2): qty 30, 30d supply, fill #2
  Filled 2023-08-28 – 2023-09-04 (×2): qty 30, 30d supply, fill #3
  Filled 2023-09-25 – 2023-10-03 (×3): qty 30, 30d supply, fill #4
  Filled 2023-10-16 – 2023-10-31 (×2): qty 30, 30d supply, fill #5
  Filled 2023-11-03 – 2023-11-28 (×3): qty 30, 30d supply, fill #6
  Filled 2023-12-12 – 2023-12-26 (×2): qty 30, 30d supply, fill #7
  Filled 2024-01-10 – 2024-01-18 (×2): qty 30, 30d supply, fill #8
  Filled 2024-02-15: qty 30, 30d supply, fill #9
  Filled 2024-03-19: qty 30, 30d supply, fill #10
  Filled 2024-04-23: qty 30, 30d supply, fill #11

## 2023-06-15 MED ORDER — AMLODIPINE BESYLATE 2.5 MG PO TABS
2.5000 mg | ORAL_TABLET | Freq: Every day | ORAL | 3 refills | Status: DC
  Filled 2023-06-15: qty 90, 90d supply, fill #0
  Filled 2023-06-21: qty 30, 30d supply, fill #0
  Filled 2023-07-14: qty 30, 30d supply, fill #1
  Filled 2023-08-09 – 2023-08-10 (×2): qty 30, 30d supply, fill #2
  Filled 2023-08-28 – 2023-09-04 (×2): qty 30, 30d supply, fill #3
  Filled 2023-09-25 – 2023-10-03 (×3): qty 30, 30d supply, fill #4
  Filled 2023-10-16 – 2023-10-31 (×2): qty 30, 30d supply, fill #5
  Filled 2023-11-03 – 2023-11-28 (×3): qty 30, 30d supply, fill #6
  Filled 2023-12-12 – 2023-12-26 (×2): qty 30, 30d supply, fill #7
  Filled 2024-01-10 – 2024-01-18 (×2): qty 30, 30d supply, fill #8
  Filled 2024-02-15: qty 30, 30d supply, fill #9
  Filled 2024-03-19: qty 30, 30d supply, fill #10
  Filled 2024-04-23: qty 30, 30d supply, fill #11

## 2023-06-15 MED ORDER — ROPINIROLE HCL 2 MG PO TABS
2.0000 mg | ORAL_TABLET | Freq: Every day | ORAL | 3 refills | Status: DC
  Filled 2023-06-15: qty 90, 90d supply, fill #0
  Filled 2023-06-21: qty 30, 30d supply, fill #0
  Filled 2023-07-14: qty 30, 30d supply, fill #1
  Filled 2023-08-09 – 2023-08-10 (×2): qty 30, 30d supply, fill #2
  Filled 2023-08-28 – 2023-09-04 (×2): qty 30, 30d supply, fill #3
  Filled 2023-09-25 – 2023-10-03 (×3): qty 30, 30d supply, fill #4
  Filled 2023-10-16 – 2023-10-31 (×2): qty 30, 30d supply, fill #5
  Filled 2023-11-03 – 2023-11-28 (×3): qty 30, 30d supply, fill #6
  Filled 2023-12-12 – 2023-12-26 (×2): qty 30, 30d supply, fill #7
  Filled 2024-01-10 – 2024-01-18 (×2): qty 30, 30d supply, fill #8
  Filled 2024-02-15: qty 30, 30d supply, fill #9
  Filled 2024-03-19: qty 30, 30d supply, fill #10
  Filled 2024-04-23: qty 30, 30d supply, fill #11

## 2023-06-15 MED ORDER — GABAPENTIN 300 MG PO CAPS
300.0000 mg | ORAL_CAPSULE | Freq: Every day | ORAL | 1 refills | Status: DC
  Filled 2023-06-15: qty 90, 90d supply, fill #0
  Filled 2023-06-21: qty 30, 30d supply, fill #0
  Filled 2023-07-14: qty 30, 30d supply, fill #1
  Filled 2023-08-09 – 2023-08-10 (×2): qty 30, 30d supply, fill #2
  Filled 2023-08-28 – 2023-09-04 (×2): qty 30, 30d supply, fill #3
  Filled 2023-09-25 – 2023-10-03 (×3): qty 30, 30d supply, fill #4
  Filled 2023-10-16 – 2023-10-31 (×2): qty 30, 30d supply, fill #5

## 2023-06-15 MED ORDER — ROSUVASTATIN CALCIUM 5 MG PO TABS
5.0000 mg | ORAL_TABLET | Freq: Every day | ORAL | 3 refills | Status: DC
  Filled 2023-06-15: qty 90, 90d supply, fill #0
  Filled 2023-06-21: qty 30, 30d supply, fill #0
  Filled 2023-07-14: qty 30, 30d supply, fill #1
  Filled 2023-08-09 – 2023-08-10 (×2): qty 30, 30d supply, fill #2
  Filled 2023-08-28 – 2023-09-04 (×2): qty 30, 30d supply, fill #3
  Filled 2023-09-25 – 2023-10-03 (×3): qty 30, 30d supply, fill #4
  Filled 2023-10-16 – 2023-10-31 (×2): qty 30, 30d supply, fill #5
  Filled 2023-11-03 – 2023-11-28 (×3): qty 30, 30d supply, fill #6
  Filled 2023-12-12 – 2023-12-26 (×2): qty 30, 30d supply, fill #7
  Filled 2024-01-10 – 2024-01-18 (×2): qty 30, 30d supply, fill #8
  Filled 2024-02-15: qty 30, 30d supply, fill #9
  Filled 2024-03-19: qty 30, 30d supply, fill #10
  Filled 2024-04-23: qty 30, 30d supply, fill #11

## 2023-06-21 ENCOUNTER — Other Ambulatory Visit: Payer: Self-pay

## 2023-06-21 ENCOUNTER — Other Ambulatory Visit (HOSPITAL_COMMUNITY): Payer: Self-pay

## 2023-07-10 ENCOUNTER — Ambulatory Visit: Payer: PPO | Admitting: Podiatry

## 2023-07-10 DIAGNOSIS — M79675 Pain in left toe(s): Secondary | ICD-10-CM

## 2023-07-10 DIAGNOSIS — B351 Tinea unguium: Secondary | ICD-10-CM

## 2023-07-10 DIAGNOSIS — L84 Corns and callosities: Secondary | ICD-10-CM

## 2023-07-10 DIAGNOSIS — M79674 Pain in right toe(s): Secondary | ICD-10-CM

## 2023-07-10 DIAGNOSIS — G629 Polyneuropathy, unspecified: Secondary | ICD-10-CM

## 2023-07-10 NOTE — Progress Notes (Signed)
  Subjective:  Patient ID: Samantha Bean, female    DOB: [birth year],  MRN: [id number]  Samantha Bean presents to clinic today for at risk foot care with history of peripheral neuropathy and callus(es) left lower extremity and painful thick toenails that are difficult to trim. Painful toenails interfere with ambulation. Aggravating factors include wearing enclosed shoe gear. Pain is relieved with periodic professional debridement. Painful calluses are aggravated when weightbearing with and without shoegear. Pain is relieved with periodic professional debridement.  New problem(s): None.    PCP is Thana Ates, MD.  No Known Allergies  Review of Systems: Negative except as noted in the HPI.  Objective: No changes noted in today's physical examination. There were no vitals filed for this visit. Samantha Bean is a pleasant 87 y.o. female WD, WN in NAD. AAO x 3.  Vascular Examination: Capillary refill time immediate b/l. Vascular status intact b/l with palpable pedal pulses. Pedal hair present b/l. No pain with calf compression b/l. Skin temperature gradient WNL b/l. No cyanosis or clubbing b/l. No ischemia or gangrene noted b/l.   Neurological Examination: Protective sensation decreased with 10 gram monofilament b/l.  Dermatological Examination: Pedal skin with normal turgor, texture and tone b/l.  No open wounds. No interdigital macerations.   Toenails 1-5 b/l thick, discolored, elongated with subungual debris and pain on dorsal palpation.   Hyperkeratotic lesion(s) submet head 1 left foot.  No erythema, no edema, no drainage, no fluctuance.  Musculoskeletal Examination: Muscle strength 5/5 to all lower extremity muscle groups bilaterally. No pain, crepitus or joint limitation noted with ROM bilateral LE. No gross bony deformities bilaterally.  Radiographs: None  Assessment/Plan: 1. Pain due to onychomycosis of toenails of both feet   2. Callus   3. Neuropathy       -Patient was evaluated and treated. All patient's and/or POA's questions/concerns answered on today's visit. -Patient to continue soft, supportive shoe gear daily. -Toenails 1-5 b/l were debrided in length and girth with sterile nail nippers and dremel without iatrogenic bleeding.  -Callus(es) submet head 1 left foot pared utilizing sterile scalpel blade without complication or incident. Total number debrided =1. -Patient/POA to call should there be question/concern in the interim.   No follow-ups on file.  Louann Sjogren, DPM

## 2023-07-14 ENCOUNTER — Other Ambulatory Visit: Payer: Self-pay

## 2023-07-18 ENCOUNTER — Other Ambulatory Visit: Payer: Self-pay

## 2023-08-01 ENCOUNTER — Inpatient Hospital Stay: Admission: RE | Admit: 2023-08-01 | Payer: PPO | Source: Ambulatory Visit

## 2023-08-09 ENCOUNTER — Other Ambulatory Visit: Payer: Self-pay

## 2023-08-10 ENCOUNTER — Other Ambulatory Visit: Payer: Self-pay

## 2023-08-28 ENCOUNTER — Other Ambulatory Visit: Payer: Self-pay

## 2023-08-30 ENCOUNTER — Other Ambulatory Visit: Payer: Self-pay

## 2023-09-01 ENCOUNTER — Other Ambulatory Visit: Payer: Self-pay

## 2023-09-04 ENCOUNTER — Other Ambulatory Visit: Payer: Self-pay

## 2023-09-18 ENCOUNTER — Other Ambulatory Visit: Payer: Self-pay | Admitting: Internal Medicine

## 2023-09-18 DIAGNOSIS — Z1231 Encounter for screening mammogram for malignant neoplasm of breast: Secondary | ICD-10-CM

## 2023-09-25 ENCOUNTER — Other Ambulatory Visit: Payer: Self-pay

## 2023-09-27 ENCOUNTER — Other Ambulatory Visit: Payer: Self-pay

## 2023-10-03 ENCOUNTER — Other Ambulatory Visit: Payer: Self-pay

## 2023-10-11 ENCOUNTER — Ambulatory Visit: Payer: PPO | Admitting: Podiatry

## 2023-10-16 ENCOUNTER — Other Ambulatory Visit: Payer: Self-pay

## 2023-10-26 ENCOUNTER — Ambulatory Visit
Admission: RE | Admit: 2023-10-26 | Discharge: 2023-10-26 | Disposition: A | Discharge status: Home / Self Care | Payer: PPO | Source: Ambulatory Visit | Attending: Internal Medicine | Admitting: Internal Medicine

## 2023-10-26 DIAGNOSIS — Z1231 Encounter for screening mammogram for malignant neoplasm of breast: Secondary | ICD-10-CM | POA: Diagnosis not present

## 2023-10-31 ENCOUNTER — Other Ambulatory Visit: Payer: Self-pay

## 2023-11-01 ENCOUNTER — Other Ambulatory Visit: Payer: Self-pay

## 2023-11-03 ENCOUNTER — Other Ambulatory Visit: Payer: Self-pay

## 2023-11-17 ENCOUNTER — Ambulatory Visit: Admitting: Podiatry

## 2023-11-22 ENCOUNTER — Encounter: Payer: Self-pay | Admitting: Podiatry

## 2023-11-22 ENCOUNTER — Ambulatory Visit: Payer: PPO | Admitting: Podiatry

## 2023-11-22 VITALS — Resp 16 | Ht 63.5 in | Wt 122.0 lb

## 2023-11-22 DIAGNOSIS — M79675 Pain in left toe(s): Secondary | ICD-10-CM

## 2023-11-22 DIAGNOSIS — L84 Corns and callosities: Secondary | ICD-10-CM | POA: Diagnosis not present

## 2023-11-22 DIAGNOSIS — B351 Tinea unguium: Secondary | ICD-10-CM

## 2023-11-22 DIAGNOSIS — G629 Polyneuropathy, unspecified: Secondary | ICD-10-CM | POA: Diagnosis not present

## 2023-11-22 DIAGNOSIS — M79674 Pain in right toe(s): Secondary | ICD-10-CM

## 2023-11-23 ENCOUNTER — Other Ambulatory Visit: Payer: Self-pay

## 2023-11-23 ENCOUNTER — Other Ambulatory Visit (HOSPITAL_COMMUNITY): Payer: Self-pay

## 2023-11-25 ENCOUNTER — Other Ambulatory Visit (HOSPITAL_COMMUNITY): Payer: Self-pay

## 2023-11-27 NOTE — Progress Notes (Signed)
  Subjective:  Patient ID: Samantha Bean, female    DOB: Apr 04, 1936,  MRN: [id number]  Samantha Bean presents to clinic today for at risk foot care with history of peripheral neuropathy and callus(es) left lower extremity and painful thick toenails that are difficult to trim. Painful toenails interfere with ambulation. Aggravating factors include wearing enclosed shoe gear. Pain is relieved with periodic professional debridement. Painful calluses are aggravated when weightbearing with and without shoegear. Pain is relieved with periodic professional debridement.  Chief Complaint  Patient presents with   rfc    She is here for a nail trim, PCP is Dr raju and seen 1 time a year.   New problem(s): None.   PCP is Samantha Ates, MD. Samantha Bean 06/27/2023.  No Known Allergies  Review of Systems: Negative except as noted in the HPI.  Objective: No changes noted in today's physical examination. Vitals:   11/22/23 1513  Resp: 16   Samantha Bean is a pleasant 88 y.o. female WD, WN in NAD. AAO x 3.  Vascular Examination: Capillary refill time immediate b/l. Vascular status intact b/l with palpable pedal pulses. Pedal hair present b/l. No pain with calf compression b/l. Skin temperature gradient WNL b/l. No cyanosis or clubbing b/l. No ischemia or gangrene noted b/l.   Neurological Examination: Protective sensation decreased with 10 gram monofilament b/l.  Dermatological Examination: Pedal skin with normal turgor, texture and tone b/l.  No open wounds. No interdigital macerations.   Toenails 1-5 b/l thick, discolored, elongated with subungual debris and pain on dorsal palpation.   Hyperkeratotic lesion(s) submet head 1 left foot.  No erythema, no edema, no drainage, no fluctuance.  Musculoskeletal Examination: Muscle strength 5/5 to all lower extremity muscle groups bilaterally. No pain, crepitus or joint limitation noted with ROM bilateral LE. No gross bony deformities  bilaterally.  Radiographs: None  Assessment/Plan: 1. Pain due to onychomycosis of toenails of both feet   2. Callus   3. Neuropathy    Patient was evaluated and treated. All patient's and/or POA's questions/concerns addressed on today's visit. Mycotic toenails 1-5 debrided in length and girth without incident. Callus(es) submet head 1 left foot pared with sharp debridement without incident. Continue soft, supportive shoe gear daily. Report any pedal injuries to medical professional. Call office if there are any questions/concerns. -Patient/POA to call should there be question/concern in the interim.   Return in about 3 months (around 02/21/2024).  Freddie Breech, DPM      [place] LOCATION: [address], [place] [address]                   Office [phone]   [facility] LOCATION: [address] [place], [place] [address] Office [phone]

## 2023-11-28 ENCOUNTER — Other Ambulatory Visit: Payer: Self-pay

## 2023-11-29 ENCOUNTER — Other Ambulatory Visit: Payer: Self-pay

## 2023-11-29 ENCOUNTER — Other Ambulatory Visit (HOSPITAL_COMMUNITY): Payer: Self-pay

## 2023-11-29 MED ORDER — GABAPENTIN 300 MG PO CAPS
300.0000 mg | ORAL_CAPSULE | Freq: Every day | ORAL | 1 refills | Status: DC
  Filled 2023-11-29: qty 30, 30d supply, fill #0
  Filled 2023-12-12 – 2023-12-26 (×2): qty 30, 30d supply, fill #1
  Filled 2024-01-10 – 2024-01-18 (×2): qty 30, 30d supply, fill #2
  Filled 2024-02-15: qty 30, 30d supply, fill #3
  Filled 2024-03-19: qty 30, 30d supply, fill #4
  Filled 2024-04-23: qty 30, 30d supply, fill #5

## 2023-11-30 ENCOUNTER — Other Ambulatory Visit: Payer: Self-pay

## 2023-12-12 ENCOUNTER — Other Ambulatory Visit: Payer: Self-pay

## 2023-12-26 ENCOUNTER — Other Ambulatory Visit: Payer: Self-pay

## 2023-12-26 DIAGNOSIS — Z0000 Encounter for general adult medical examination without abnormal findings: Secondary | ICD-10-CM | POA: Diagnosis not present

## 2023-12-26 DIAGNOSIS — K219 Gastro-esophageal reflux disease without esophagitis: Secondary | ICD-10-CM | POA: Diagnosis not present

## 2023-12-26 DIAGNOSIS — G609 Hereditary and idiopathic neuropathy, unspecified: Secondary | ICD-10-CM | POA: Diagnosis not present

## 2023-12-26 DIAGNOSIS — R42 Dizziness and giddiness: Secondary | ICD-10-CM | POA: Diagnosis not present

## 2023-12-26 DIAGNOSIS — I10 Essential (primary) hypertension: Secondary | ICD-10-CM | POA: Diagnosis not present

## 2023-12-26 DIAGNOSIS — R413 Other amnesia: Secondary | ICD-10-CM | POA: Diagnosis not present

## 2023-12-26 DIAGNOSIS — Z853 Personal history of malignant neoplasm of breast: Secondary | ICD-10-CM | POA: Diagnosis not present

## 2023-12-26 DIAGNOSIS — N1831 Chronic kidney disease, stage 3a: Secondary | ICD-10-CM | POA: Diagnosis not present

## 2023-12-26 DIAGNOSIS — M8589 Other specified disorders of bone density and structure, multiple sites: Secondary | ICD-10-CM | POA: Diagnosis not present

## 2023-12-26 DIAGNOSIS — G2581 Restless legs syndrome: Secondary | ICD-10-CM | POA: Diagnosis not present

## 2023-12-26 DIAGNOSIS — E785 Hyperlipidemia, unspecified: Secondary | ICD-10-CM | POA: Diagnosis not present

## 2023-12-27 ENCOUNTER — Other Ambulatory Visit: Payer: Self-pay

## 2024-01-01 ENCOUNTER — Other Ambulatory Visit: Payer: Self-pay

## 2024-01-10 ENCOUNTER — Other Ambulatory Visit: Payer: Self-pay

## 2024-01-12 ENCOUNTER — Other Ambulatory Visit (HOSPITAL_COMMUNITY): Payer: Self-pay

## 2024-01-18 ENCOUNTER — Other Ambulatory Visit: Payer: Self-pay

## 2024-01-19 ENCOUNTER — Other Ambulatory Visit: Payer: Self-pay

## 2024-01-20 DIAGNOSIS — E785 Hyperlipidemia, unspecified: Secondary | ICD-10-CM | POA: Diagnosis not present

## 2024-01-20 DIAGNOSIS — I10 Essential (primary) hypertension: Secondary | ICD-10-CM | POA: Diagnosis not present

## 2024-01-20 DIAGNOSIS — Z853 Personal history of malignant neoplasm of breast: Secondary | ICD-10-CM | POA: Diagnosis not present

## 2024-01-20 DIAGNOSIS — N1831 Chronic kidney disease, stage 3a: Secondary | ICD-10-CM | POA: Diagnosis not present

## 2024-02-15 ENCOUNTER — Other Ambulatory Visit: Payer: Self-pay

## 2024-02-19 ENCOUNTER — Other Ambulatory Visit: Payer: Self-pay

## 2024-02-19 DIAGNOSIS — Z853 Personal history of malignant neoplasm of breast: Secondary | ICD-10-CM | POA: Diagnosis not present

## 2024-02-19 DIAGNOSIS — N1831 Chronic kidney disease, stage 3a: Secondary | ICD-10-CM | POA: Diagnosis not present

## 2024-02-19 DIAGNOSIS — I10 Essential (primary) hypertension: Secondary | ICD-10-CM | POA: Diagnosis not present

## 2024-02-19 DIAGNOSIS — E785 Hyperlipidemia, unspecified: Secondary | ICD-10-CM | POA: Diagnosis not present

## 2024-03-07 ENCOUNTER — Other Ambulatory Visit: Payer: Self-pay

## 2024-03-08 ENCOUNTER — Other Ambulatory Visit: Payer: Self-pay

## 2024-03-12 ENCOUNTER — Ambulatory Visit: Admitting: Podiatry

## 2024-03-12 ENCOUNTER — Encounter: Payer: Self-pay | Admitting: Podiatry

## 2024-03-12 DIAGNOSIS — B351 Tinea unguium: Secondary | ICD-10-CM | POA: Diagnosis not present

## 2024-03-12 DIAGNOSIS — M79675 Pain in left toe(s): Secondary | ICD-10-CM | POA: Diagnosis not present

## 2024-03-12 DIAGNOSIS — G629 Polyneuropathy, unspecified: Secondary | ICD-10-CM | POA: Diagnosis not present

## 2024-03-12 DIAGNOSIS — M79674 Pain in right toe(s): Secondary | ICD-10-CM | POA: Diagnosis not present

## 2024-03-17 NOTE — Progress Notes (Signed)
  Subjective:  Patient ID: Samantha Bean, female    DOB: [birth year],  MRN: [id number]  Samantha Bean presents to clinic today for at risk foot care with history of peripheral neuropathy and painful mycotic toenails x 10 which interfere with daily activities. Pain is relieved with periodic professional debridement.  Chief Complaint  Patient presents with   RFC     RFC toenail trim. LOV with PCP 01/20/24.   New problem(s): None.   PCP is Dwight Trula SQUIBB, MD.  No Known Allergies  Review of Systems: Negative except as noted in the HPI.  Objective: No changes noted in today's physical examination. There were no vitals filed for this visit. [name] is a pleasant 88 y.o. female WD, WN in NAD. AAO x 3.  Vascular Examination: Capillary refill time immediate b/l. Vascular status intact b/l with palpable pedal pulses. Pedal hair present b/l. No pain with calf compression b/l. Skin temperature gradient WNL b/l. No cyanosis or clubbing b/l. No ischemia or gangrene noted b/l. No edema noted b/l LE.  Neurological Examination: Protective sensation decreased with 10 gram monofilament b/l.  Dermatological Examination: Pedal skin with normal turgor, texture and tone b/l.  No open wounds. No interdigital macerations.   Toenails 1-5 b/l thick, discolored, elongated with subungual debris and pain on dorsal palpation.   No hyperkeratotic nor porokeratotic lesions.  Musculoskeletal Examination: Muscle strength 5/5 to all lower extremity muscle groups bilaterally. No pain, crepitus or joint limitation noted with ROM bilateral LE. No gross bony deformities bilaterally.  Radiographs: None  Assessment/Plan: 1. Pain due to onychomycosis of toenails of both feet   2. Neuropathy     Consent given for treatment. Patient examined. All patient's and/or POA's questions/concerns addressed on today's visit. Toenails 1-5 debrided in length and girth without incident. Continue soft, supportive shoe  gear daily. Treatment was provided by assistant Andrez Manchester under my supervision. Report any pedal injuries to medical professional. Call office if there are any questions/concerns.  Return in about 3 months (around 06/12/2024).  Delon LITTIE Merlin, DPM      [place] LOCATION: [address], [place] [address]                   Office [phone]   [facility] LOCATION: [address] [place], [place] [address] Office [phone]

## 2024-03-19 ENCOUNTER — Other Ambulatory Visit: Payer: Self-pay

## 2024-03-20 ENCOUNTER — Other Ambulatory Visit: Payer: Self-pay

## 2024-03-21 DIAGNOSIS — E785 Hyperlipidemia, unspecified: Secondary | ICD-10-CM | POA: Diagnosis not present

## 2024-03-21 DIAGNOSIS — N1831 Chronic kidney disease, stage 3a: Secondary | ICD-10-CM | POA: Diagnosis not present

## 2024-03-21 DIAGNOSIS — I10 Essential (primary) hypertension: Secondary | ICD-10-CM | POA: Diagnosis not present

## 2024-03-21 DIAGNOSIS — Z853 Personal history of malignant neoplasm of breast: Secondary | ICD-10-CM | POA: Diagnosis not present

## 2024-04-10 ENCOUNTER — Other Ambulatory Visit: Payer: Self-pay

## 2024-04-21 DIAGNOSIS — I10 Essential (primary) hypertension: Secondary | ICD-10-CM | POA: Diagnosis not present

## 2024-04-21 DIAGNOSIS — E785 Hyperlipidemia, unspecified: Secondary | ICD-10-CM | POA: Diagnosis not present

## 2024-04-21 DIAGNOSIS — Z853 Personal history of malignant neoplasm of breast: Secondary | ICD-10-CM | POA: Diagnosis not present

## 2024-04-21 DIAGNOSIS — N1831 Chronic kidney disease, stage 3a: Secondary | ICD-10-CM | POA: Diagnosis not present

## 2024-04-23 ENCOUNTER — Other Ambulatory Visit: Payer: Self-pay

## 2024-04-24 ENCOUNTER — Other Ambulatory Visit: Payer: Self-pay

## 2024-04-26 ENCOUNTER — Other Ambulatory Visit: Payer: Self-pay

## 2024-05-15 ENCOUNTER — Other Ambulatory Visit (HOSPITAL_COMMUNITY): Payer: Self-pay

## 2024-05-15 ENCOUNTER — Other Ambulatory Visit: Payer: Self-pay

## 2024-05-15 MED ORDER — AMLODIPINE BESYLATE 2.5 MG PO TABS
2.5000 mg | ORAL_TABLET | Freq: Every day | ORAL | 0 refills | Status: DC
  Filled 2024-05-15: qty 90, 90d supply, fill #0
  Filled 2024-05-28: qty 30, 30d supply, fill #0
  Filled 2024-07-01: qty 30, 30d supply, fill #1
  Filled 2024-07-22: qty 30, 30d supply, fill #2

## 2024-05-15 MED ORDER — HYDROCHLOROTHIAZIDE 25 MG PO TABS
25.0000 mg | ORAL_TABLET | Freq: Every morning | ORAL | 0 refills | Status: DC
  Filled 2024-05-15: qty 90, 90d supply, fill #0
  Filled 2024-05-28: qty 30, 30d supply, fill #0
  Filled 2024-07-01: qty 30, 30d supply, fill #1
  Filled 2024-07-22: qty 30, 30d supply, fill #2

## 2024-05-15 MED ORDER — ROSUVASTATIN CALCIUM 5 MG PO TABS
5.0000 mg | ORAL_TABLET | Freq: Every day | ORAL | 0 refills | Status: DC
  Filled 2024-05-15: qty 90, 90d supply, fill #0
  Filled 2024-05-28: qty 30, 30d supply, fill #0
  Filled 2024-07-01: qty 30, 30d supply, fill #1
  Filled 2024-07-22: qty 30, 30d supply, fill #2

## 2024-05-15 MED ORDER — ROPINIROLE HCL 2 MG PO TABS
2.0000 mg | ORAL_TABLET | Freq: Every day | ORAL | 0 refills | Status: DC
  Filled 2024-05-15: qty 90, 90d supply, fill #0
  Filled 2024-05-28: qty 30, 30d supply, fill #0
  Filled 2024-07-01: qty 30, 30d supply, fill #1
  Filled 2024-07-22: qty 30, 30d supply, fill #2

## 2024-05-16 ENCOUNTER — Other Ambulatory Visit: Payer: Self-pay

## 2024-05-16 ENCOUNTER — Other Ambulatory Visit (HOSPITAL_COMMUNITY): Payer: Self-pay

## 2024-05-16 MED ORDER — GABAPENTIN 300 MG PO CAPS
300.0000 mg | ORAL_CAPSULE | Freq: Every day | ORAL | 1 refills | Status: AC
  Filled 2024-05-28: qty 30, 30d supply, fill #0
  Filled 2024-07-01: qty 30, 30d supply, fill #1
  Filled 2024-07-22: qty 30, 30d supply, fill #2
  Filled 2024-08-26: qty 30, 30d supply, fill #3
  Filled 2024-09-24: qty 30, 30d supply, fill #4

## 2024-05-21 DIAGNOSIS — I10 Essential (primary) hypertension: Secondary | ICD-10-CM | POA: Diagnosis not present

## 2024-05-21 DIAGNOSIS — E785 Hyperlipidemia, unspecified: Secondary | ICD-10-CM | POA: Diagnosis not present

## 2024-05-21 DIAGNOSIS — N1831 Chronic kidney disease, stage 3a: Secondary | ICD-10-CM | POA: Diagnosis not present

## 2024-05-21 DIAGNOSIS — Z853 Personal history of malignant neoplasm of breast: Secondary | ICD-10-CM | POA: Diagnosis not present

## 2024-05-24 ENCOUNTER — Other Ambulatory Visit: Payer: Self-pay

## 2024-05-28 ENCOUNTER — Other Ambulatory Visit (HOSPITAL_COMMUNITY): Payer: Self-pay

## 2024-05-28 ENCOUNTER — Other Ambulatory Visit: Payer: Self-pay

## 2024-05-29 ENCOUNTER — Other Ambulatory Visit: Payer: Self-pay

## 2024-06-21 ENCOUNTER — Other Ambulatory Visit: Payer: Self-pay

## 2024-06-21 DIAGNOSIS — N1831 Chronic kidney disease, stage 3a: Secondary | ICD-10-CM | POA: Diagnosis not present

## 2024-06-21 DIAGNOSIS — E785 Hyperlipidemia, unspecified: Secondary | ICD-10-CM | POA: Diagnosis not present

## 2024-06-21 DIAGNOSIS — Z853 Personal history of malignant neoplasm of breast: Secondary | ICD-10-CM | POA: Diagnosis not present

## 2024-06-21 DIAGNOSIS — I10 Essential (primary) hypertension: Secondary | ICD-10-CM | POA: Diagnosis not present

## 2024-06-25 ENCOUNTER — Ambulatory Visit: Admitting: Podiatry

## 2024-06-25 ENCOUNTER — Encounter: Payer: Self-pay | Admitting: Podiatry

## 2024-06-25 DIAGNOSIS — B351 Tinea unguium: Secondary | ICD-10-CM | POA: Diagnosis not present

## 2024-06-25 DIAGNOSIS — M79674 Pain in right toe(s): Secondary | ICD-10-CM | POA: Diagnosis not present

## 2024-06-25 DIAGNOSIS — M79675 Pain in left toe(s): Secondary | ICD-10-CM

## 2024-06-25 DIAGNOSIS — G629 Polyneuropathy, unspecified: Secondary | ICD-10-CM

## 2024-06-30 NOTE — Progress Notes (Signed)
  Subjective:  Patient ID: Samantha Bean, female    DOB: Dec 24, 1935,  MRN: [id number]  Samantha Bean presents to clinic today for at risk foot care with history of peripheral neuropathy and painful mycotic toenails of both feet that are difficult to trim. Pain interferes with daily activities and wearing enclosed shoe gear comfortably. Patient states her left great toe is painful. Chief Complaint  Patient presents with   RFC    RFC not diabetic PCP Samantha Trula SQUIBB, Samantha Bean, 04/21/24   New problem(s): None.   PCP is Samantha Trula SQUIBB, Samantha Bean.  No Known Allergies  Review of Systems: Negative except as noted in the HPI.  Objective: No changes noted in today's physical examination. There were no vitals filed for this visit. Samantha Bean is a pleasant 88 y.o. female WD, WN in NAD. AAO x 3.  Vascular Examination: Capillary refill time immediate b/l. Vascular status intact b/l with palpable pedal pulses. Pedal hair present b/l. No pain with calf compression b/l. Skin temperature gradient WNL b/l. No cyanosis or clubbing b/l. No ischemia or gangrene noted b/l. No edema noted b/l LE.  Neurological Examination: Protective sensation decreased with 10 gram monofilament b/l.  Dermatological Examination: Pedal skin with normal turgor, texture and tone b/l.  No open wounds. No interdigital macerations.   Toenails 1-5 b/l thick, discolored, elongated with subungual debris and pain on dorsal palpation.   No hyperkeratotic nor porokeratotic lesions.  Musculoskeletal Examination: Muscle strength 5/5 to all lower extremity muscle groups bilaterally. No pain, crepitus or joint limitation noted with ROM bilateral LE. No gross bony deformities bilaterally.  Radiographs: None  Assessment/Plan: 1. Pain due to onychomycosis of toenails of both feet   2. Neuropathy   Patient was evaluated and treated. All patient's and/or POA's questions/concerns addressed on today's visit. Mycotic toenails 1-5 b/l debrided  in length and girth without incident. Continue soft, supportive shoe gear daily. Report any pedal injuries to medical professional. Call office if there are any quesitons/concerns. -Patient/POA to call should there be question/concern in the interim.   Return in about 3 months (around 09/25/2024).  Samantha Bean, DPM      [facility] LOCATION: [address] [place] [address]                   Office [phone]   [facility] LOCATION: [address] [place], [place] [address] Office [phone]

## 2024-07-01 ENCOUNTER — Other Ambulatory Visit: Payer: Self-pay

## 2024-07-06 ENCOUNTER — Other Ambulatory Visit: Payer: Self-pay

## 2024-07-11 ENCOUNTER — Other Ambulatory Visit: Payer: Self-pay

## 2024-07-15 DIAGNOSIS — G609 Hereditary and idiopathic neuropathy, unspecified: Secondary | ICD-10-CM | POA: Diagnosis not present

## 2024-07-15 DIAGNOSIS — I10 Essential (primary) hypertension: Secondary | ICD-10-CM | POA: Diagnosis not present

## 2024-07-15 DIAGNOSIS — G3184 Mild cognitive impairment, so stated: Secondary | ICD-10-CM | POA: Diagnosis not present

## 2024-07-17 ENCOUNTER — Other Ambulatory Visit: Payer: Self-pay

## 2024-07-21 DIAGNOSIS — Z853 Personal history of malignant neoplasm of breast: Secondary | ICD-10-CM | POA: Diagnosis not present

## 2024-07-21 DIAGNOSIS — N1831 Chronic kidney disease, stage 3a: Secondary | ICD-10-CM | POA: Diagnosis not present

## 2024-07-21 DIAGNOSIS — I10 Essential (primary) hypertension: Secondary | ICD-10-CM | POA: Diagnosis not present

## 2024-07-21 DIAGNOSIS — E785 Hyperlipidemia, unspecified: Secondary | ICD-10-CM | POA: Diagnosis not present

## 2024-07-22 ENCOUNTER — Other Ambulatory Visit: Payer: Self-pay

## 2024-07-24 ENCOUNTER — Other Ambulatory Visit: Payer: Self-pay

## 2024-08-26 ENCOUNTER — Other Ambulatory Visit (HOSPITAL_COMMUNITY): Payer: Self-pay

## 2024-08-26 ENCOUNTER — Other Ambulatory Visit: Payer: Self-pay

## 2024-08-26 MED ORDER — AMLODIPINE BESYLATE 2.5 MG PO TABS
2.5000 mg | ORAL_TABLET | Freq: Every day | ORAL | 0 refills | Status: AC
  Filled 2024-08-26: qty 90, 90d supply, fill #0
  Filled 2024-09-24: qty 30, 30d supply, fill #1

## 2024-08-26 MED ORDER — HYDROCHLOROTHIAZIDE 25 MG PO TABS
25.0000 mg | ORAL_TABLET | Freq: Every morning | ORAL | 0 refills | Status: AC
  Filled 2024-08-26: qty 90, 90d supply, fill #0
  Filled 2024-09-24: qty 30, 30d supply, fill #1

## 2024-08-26 MED ORDER — ROPINIROLE HCL 2 MG PO TABS
2.0000 mg | ORAL_TABLET | Freq: Every day | ORAL | 0 refills | Status: AC
  Filled 2024-08-26: qty 90, 90d supply, fill #0
  Filled 2024-09-24: qty 30, 30d supply, fill #1

## 2024-08-26 MED ORDER — ROSUVASTATIN CALCIUM 5 MG PO TABS
5.0000 mg | ORAL_TABLET | Freq: Every day | ORAL | 0 refills | Status: AC
  Filled 2024-08-26: qty 90, 90d supply, fill #0
  Filled 2024-09-24: qty 30, 30d supply, fill #1

## 2024-08-27 ENCOUNTER — Other Ambulatory Visit: Payer: Self-pay

## 2024-09-19 ENCOUNTER — Other Ambulatory Visit: Payer: Self-pay | Admitting: Internal Medicine

## 2024-09-19 DIAGNOSIS — Z1231 Encounter for screening mammogram for malignant neoplasm of breast: Secondary | ICD-10-CM

## 2024-09-24 ENCOUNTER — Other Ambulatory Visit: Payer: Self-pay

## 2024-10-23 ENCOUNTER — Ambulatory Visit: Admitting: Podiatry

## 2024-10-28 ENCOUNTER — Ambulatory Visit
